# Patient Record
Sex: Female | Born: 1935 | Race: White | Hispanic: No | Smoking: Former smoker
Health system: Southern US, Community
[De-identification: ages and names within clinical notes are randomized; demographics above are authoritative.]

## PROBLEM LIST (undated history)

## (undated) DIAGNOSIS — K635 Polyp of colon: Secondary | ICD-10-CM

## (undated) DIAGNOSIS — R911 Solitary pulmonary nodule: Secondary | ICD-10-CM

## (undated) DIAGNOSIS — G629 Polyneuropathy, unspecified: Secondary | ICD-10-CM

## (undated) DIAGNOSIS — J300 Vasomotor rhinitis: Secondary | ICD-10-CM

## (undated) DIAGNOSIS — I700 Atherosclerosis of aorta: Secondary | ICD-10-CM

## (undated) DIAGNOSIS — R0600 Dyspnea, unspecified: Secondary | ICD-10-CM

## (undated) DIAGNOSIS — M0510 Rheumatoid lung disease with rheumatoid arthritis of unspecified site: Secondary | ICD-10-CM

## (undated) DIAGNOSIS — M419 Scoliosis, unspecified: Secondary | ICD-10-CM

## (undated) DIAGNOSIS — C801 Malignant (primary) neoplasm, unspecified: Secondary | ICD-10-CM

## (undated) DIAGNOSIS — K2270 Barrett's esophagus without dysplasia: Secondary | ICD-10-CM

## (undated) DIAGNOSIS — G43009 Migraine without aura, not intractable, without status migrainosus: Secondary | ICD-10-CM

## (undated) DIAGNOSIS — K529 Noninfective gastroenteritis and colitis, unspecified: Secondary | ICD-10-CM

## (undated) DIAGNOSIS — I2699 Other pulmonary embolism without acute cor pulmonale: Secondary | ICD-10-CM

## (undated) DIAGNOSIS — M8580 Other specified disorders of bone density and structure, unspecified site: Secondary | ICD-10-CM

## (undated) DIAGNOSIS — M1990 Unspecified osteoarthritis, unspecified site: Secondary | ICD-10-CM

## (undated) DIAGNOSIS — N183 Chronic kidney disease, stage 3 unspecified: Secondary | ICD-10-CM

## (undated) DIAGNOSIS — I272 Pulmonary hypertension, unspecified: Secondary | ICD-10-CM

## (undated) DIAGNOSIS — D649 Anemia, unspecified: Secondary | ICD-10-CM

## (undated) DIAGNOSIS — I050 Rheumatic mitral stenosis: Secondary | ICD-10-CM

## (undated) DIAGNOSIS — E785 Hyperlipidemia, unspecified: Secondary | ICD-10-CM

## (undated) DIAGNOSIS — I739 Peripheral vascular disease, unspecified: Secondary | ICD-10-CM

## (undated) DIAGNOSIS — I10 Essential (primary) hypertension: Secondary | ICD-10-CM

## (undated) DIAGNOSIS — I2510 Atherosclerotic heart disease of native coronary artery without angina pectoris: Secondary | ICD-10-CM

## (undated) DIAGNOSIS — E119 Type 2 diabetes mellitus without complications: Secondary | ICD-10-CM

## (undated) DIAGNOSIS — K5792 Diverticulitis of intestine, part unspecified, without perforation or abscess without bleeding: Secondary | ICD-10-CM

## (undated) DIAGNOSIS — L661 Lichen planopilaris, unspecified: Secondary | ICD-10-CM

## (undated) DIAGNOSIS — C4492 Squamous cell carcinoma of skin, unspecified: Secondary | ICD-10-CM

## (undated) DIAGNOSIS — J432 Centrilobular emphysema: Secondary | ICD-10-CM

## (undated) DIAGNOSIS — I517 Cardiomegaly: Secondary | ICD-10-CM

## (undated) DIAGNOSIS — I779 Disorder of arteries and arterioles, unspecified: Secondary | ICD-10-CM

## (undated) DIAGNOSIS — K219 Gastro-esophageal reflux disease without esophagitis: Secondary | ICD-10-CM

## (undated) DIAGNOSIS — M51369 Other intervertebral disc degeneration, lumbar region without mention of lumbar back pain or lower extremity pain: Secondary | ICD-10-CM

## (undated) DIAGNOSIS — K8020 Calculus of gallbladder without cholecystitis without obstruction: Secondary | ICD-10-CM

## (undated) HISTORY — PX: BREAST BIOPSY: SHX20

## (undated) HISTORY — PX: TONSILLECTOMY: SUR1361

## (undated) HISTORY — PX: APPENDECTOMY: SHX54

---

## 2006-02-20 ENCOUNTER — Ambulatory Visit: Payer: Self-pay | Admitting: Internal Medicine

## 2006-05-08 ENCOUNTER — Ambulatory Visit: Payer: Self-pay | Admitting: Ophthalmology

## 2006-05-14 ENCOUNTER — Ambulatory Visit: Payer: Self-pay | Admitting: Ophthalmology

## 2007-02-24 ENCOUNTER — Ambulatory Visit: Payer: Self-pay | Admitting: Internal Medicine

## 2007-02-26 ENCOUNTER — Ambulatory Visit: Payer: Self-pay | Admitting: Internal Medicine

## 2008-03-17 ENCOUNTER — Ambulatory Visit: Payer: Self-pay | Admitting: Internal Medicine

## 2008-07-18 ENCOUNTER — Ambulatory Visit: Payer: Self-pay | Admitting: Internal Medicine

## 2009-04-03 ENCOUNTER — Ambulatory Visit: Payer: Self-pay | Admitting: Internal Medicine

## 2010-04-05 ENCOUNTER — Ambulatory Visit: Payer: Self-pay | Admitting: Internal Medicine

## 2010-04-16 ENCOUNTER — Ambulatory Visit: Payer: Self-pay | Admitting: Internal Medicine

## 2010-12-20 ENCOUNTER — Other Ambulatory Visit: Payer: Self-pay | Admitting: Internal Medicine

## 2011-04-23 ENCOUNTER — Ambulatory Visit: Payer: Self-pay | Admitting: Podiatry

## 2011-04-23 ENCOUNTER — Ambulatory Visit: Payer: Self-pay | Admitting: Internal Medicine

## 2012-04-30 ENCOUNTER — Ambulatory Visit: Payer: Self-pay | Admitting: Internal Medicine

## 2013-04-07 ENCOUNTER — Ambulatory Visit: Payer: Self-pay | Admitting: Gastroenterology

## 2013-05-12 ENCOUNTER — Ambulatory Visit: Payer: Self-pay | Admitting: Gastroenterology

## 2013-05-14 ENCOUNTER — Ambulatory Visit: Payer: Self-pay | Admitting: Internal Medicine

## 2013-08-30 DIAGNOSIS — E559 Vitamin D deficiency, unspecified: Secondary | ICD-10-CM | POA: Insufficient documentation

## 2013-08-30 DIAGNOSIS — K2100 Gastro-esophageal reflux disease with esophagitis, without bleeding: Secondary | ICD-10-CM | POA: Insufficient documentation

## 2013-08-30 DIAGNOSIS — J300 Vasomotor rhinitis: Secondary | ICD-10-CM | POA: Insufficient documentation

## 2013-08-30 DIAGNOSIS — E1169 Type 2 diabetes mellitus with other specified complication: Secondary | ICD-10-CM | POA: Insufficient documentation

## 2013-08-30 DIAGNOSIS — M8580 Other specified disorders of bone density and structure, unspecified site: Secondary | ICD-10-CM | POA: Insufficient documentation

## 2013-09-23 DIAGNOSIS — Z79899 Other long term (current) drug therapy: Secondary | ICD-10-CM | POA: Insufficient documentation

## 2013-09-23 DIAGNOSIS — K2270 Barrett's esophagus without dysplasia: Secondary | ICD-10-CM | POA: Insufficient documentation

## 2013-09-23 DIAGNOSIS — I6529 Occlusion and stenosis of unspecified carotid artery: Secondary | ICD-10-CM | POA: Insufficient documentation

## 2013-09-23 DIAGNOSIS — N183 Chronic kidney disease, stage 3 unspecified: Secondary | ICD-10-CM | POA: Insufficient documentation

## 2013-09-28 ENCOUNTER — Ambulatory Visit: Payer: Self-pay | Admitting: Gastroenterology

## 2013-09-28 ENCOUNTER — Ambulatory Visit: Payer: Self-pay | Admitting: Family

## 2014-05-18 ENCOUNTER — Ambulatory Visit: Payer: Self-pay | Admitting: Internal Medicine

## 2015-02-08 ENCOUNTER — Ambulatory Visit: Payer: Medicare PPO | Admitting: Occupational Therapy

## 2015-02-10 ENCOUNTER — Ambulatory Visit: Payer: Medicare PPO | Admitting: Occupational Therapy

## 2015-02-13 ENCOUNTER — Ambulatory Visit: Payer: Medicare PPO | Attending: Rheumatology | Admitting: Occupational Therapy

## 2015-02-13 ENCOUNTER — Ambulatory Visit: Payer: Medicare PPO | Admitting: Occupational Therapy

## 2015-02-13 DIAGNOSIS — M79642 Pain in left hand: Secondary | ICD-10-CM | POA: Diagnosis not present

## 2015-02-13 DIAGNOSIS — M79641 Pain in right hand: Secondary | ICD-10-CM

## 2015-02-13 NOTE — Therapy (Signed)
[facility] [address], [place], [address] Phone: [phone]   Fax:  [phone]  Occupational Therapy Treatment  Patient Details  Name: Katie Cunningham MRN: [id number] Date of Birth: 01/02/1932 Referring Provider: Jefm Bryant  Encounter Date: 02/13/2015      OT End of Session - 02/13/15 1714    Visit Number 1   Number of Visits 1   Date for OT Re-Evaluation 02/13/15   OT Start Time 1140   OT Stop Time 1221   OT Time Calculation (min) 41 min   Activity Tolerance Patient tolerated treatment well   Behavior During Therapy [facility] for tasks assessed/performed      No past medical history on file.  No past surgical history on file.  There were no vitals filed for this visit.  Visit Diagnosis:  Pain of left hand  Pain of right hand      Subjective Assessment - 02/13/15 1706    Subjective  My R hand thumb and sometimes the index finger locks , especially when I blowdry my hair   Patient Stated Goals Don't want my hands to get worse   Currently in Pain? Yes   Pain Score 3    Pain Location Finger (Comment which one)   Pain Orientation Right   Aggravating Factors  When gripping object tight             [facility] OT Assessment - 02/13/15 0001    Assessment   Diagnosis Bilateral hand pain    Referring Provider [facility]   Onset Date 01/30/15   Assessment Pt present with referrall for hand therapy - pt denies pain except when R thumb locks when blow drying    Balance Screen   Has the patient fallen in the past 6 months No   Has the patient had a decrease in activity level because of a fear of falling?  No   Is the patient reluctant to leave their home because of a fear of falling?  No   Home  Environment   Lives With Alone   Prior Function   Level of Independence Independent   Leisure R hand dominant, do own housework, bowl once week , read, do puzzles and play on computer garmes   Strength   Right Hand Grip (lbs) 39   Right Hand Lateral Pinch 14 lbs   Right Hand 3 Point Pinch 15 lbs   Left Hand Grip (lbs) 25   Left Hand Lateral Pinch 13 lbs   Left Hand 3 Point Pinch 15 lbs   Right Hand AROM   R Thumb Radial ABduction/ADduction 0-55 35   R Thumb Palmar ABduction/ADduction 0-45 50   R Thumb Opposition to Index --  WNL   Left Hand AROM   L Thumb Radial ADduction/ABduction 0-55 33   L Thumb Palmar ADduction/ABduction 0-45 40   L Thumb Opposition to Index --  WNL                          OT Education - 02/13/15 1713    Education provided Yes   Education Details HEP and joint protection   Person(s) Educated Patient   Methods Explanation;Demonstration;Tactile cues;Verbal cues;Handout   Comprehension Verbal cues required;Returned demonstration;Verbalized understanding             OT Long Term Goals - 02/13/15 1721    OT LONG TERM GOAL #1   Title Pt able to  demo HEP independent - and verbalize 2 AE to ease tasks at home   Time 1   Period Days   Status Achieved               Plan - 23-Feb-2015 1714    Clinical Impression Statement Pt present with referral for pain - only pain is when R thumb locks mostly when blow drying hair - pt has normal AROM in bilateral hands except PA and RA - provided HEP . Also reviewed with pt modifications and jointprotection  to maintain ROM and decrease triggering of thumb - hand out provided. Pt has more than normal grip and prehension strenght for her age - except L grip decrease but pt do not complain    OT Frequency One time visit   Consulted and Agree with Plan of Care Patient          G-Codes - 2015/02/23 1722    Functional Assessment Tool Used ROM , grip , clnicial judgement - pain scale   Functional Limitation Self care   Self Care Current Status [id number]) At least 1 percent but less than 20 percent impaired, limited or restricted   Self Care Goal Status [id number]) At least 1 percent but less than 20  percent impaired, limited or restricted      Problem List There are no active problems to display for this patient.   Rosalyn Gess  OTR/L,CLT  February 23, 2015, 5:28 PM  [facility] [address], [place], [address] Phone: [phone]   Fax:  [phone]  Name: Katie Cunningham MRN: [id number] Date of Birth: 12-06-1931

## 2015-02-13 NOTE — Patient Instructions (Signed)
HEP for ROM provided - after moist heat in am and pm  Tendon glides Opposition Thumb PA and RA  Bilateral hands  Ed and hand out provided for joint protection and AE

## 2015-04-03 ENCOUNTER — Other Ambulatory Visit: Payer: Self-pay | Admitting: Internal Medicine

## 2015-04-03 DIAGNOSIS — Z1231 Encounter for screening mammogram for malignant neoplasm of breast: Secondary | ICD-10-CM

## 2015-05-24 ENCOUNTER — Ambulatory Visit
Admission: RE | Admit: 2015-05-24 | Discharge: 2015-05-24 | Disposition: A | Discharge status: Home / Self Care | Payer: Medicare PPO | Source: Ambulatory Visit | Attending: Internal Medicine | Admitting: Internal Medicine

## 2015-05-24 DIAGNOSIS — Z1231 Encounter for screening mammogram for malignant neoplasm of breast: Secondary | ICD-10-CM | POA: Diagnosis not present

## 2015-05-24 HISTORY — DX: Malignant (primary) neoplasm, unspecified: C80.1

## 2015-08-01 ENCOUNTER — Emergency Department: Payer: Medicare PPO

## 2015-08-01 ENCOUNTER — Encounter: Payer: Self-pay | Admitting: *Deleted

## 2015-08-01 ENCOUNTER — Emergency Department
Admission: EM | Admit: 2015-08-01 | Discharge: 2015-08-01 | Disposition: A | Discharge status: Home / Self Care | Payer: Medicare PPO | Attending: Emergency Medicine | Admitting: Emergency Medicine

## 2015-08-01 DIAGNOSIS — R262 Difficulty in walking, not elsewhere classified: Secondary | ICD-10-CM | POA: Diagnosis not present

## 2015-08-01 DIAGNOSIS — M79605 Pain in left leg: Secondary | ICD-10-CM | POA: Diagnosis present

## 2015-08-01 DIAGNOSIS — R52 Pain, unspecified: Secondary | ICD-10-CM

## 2015-08-01 DIAGNOSIS — M79662 Pain in left lower leg: Secondary | ICD-10-CM | POA: Diagnosis not present

## 2015-08-01 DIAGNOSIS — Z85828 Personal history of other malignant neoplasm of skin: Secondary | ICD-10-CM | POA: Diagnosis not present

## 2015-08-01 DIAGNOSIS — R2689 Other abnormalities of gait and mobility: Secondary | ICD-10-CM

## 2015-08-01 LAB — BASIC METABOLIC PANEL
Anion gap: 8 (ref 5–15)
BUN: 25 mg/dL — AB (ref 6–20)
CO2: 26 mmol/L (ref 22–32)
CREATININE: 1.09 mg/dL — AB (ref 0.44–1.00)
Calcium: 9.8 mg/dL (ref 8.9–10.3)
Chloride: 104 mmol/L (ref 101–111)
GFR calc Af Amer: 54 mL/min — ABNORMAL LOW (ref >60)
GFR, EST NON AFRICAN AMERICAN: 46 mL/min — AB (ref >60)
Glucose, Bld: 159 mg/dL — ABNORMAL HIGH (ref 65–99)
Potassium: 4.3 mmol/L (ref 3.5–5.1)
SODIUM: 138 mmol/L (ref 135–145)

## 2015-08-01 MED ORDER — OXYCODONE HCL 5 MG PO TABS: 5.0000 mg | ORAL_TABLET | Freq: Three times a day (TID) | ORAL | Status: AC | PRN

## 2015-08-01 NOTE — Discharge Instructions (Signed)
You may ice your calf for 10 minutes every 2 hours if this improves her pain. You may continue to take Tylenol, and take oxycodone for severe pain that is not improved with just Tylenol. Do not drive within 8 hours of taking oxycodone.  Return to the emergency department if you develop severe pain, chest pain or shortness of breath, fever, or any other symptoms concerning to you.

## 2015-08-01 NOTE — ED Notes (Signed)
Pt complains of left lower leg pain, pt denies chest pain and any other symptoms

## 2015-08-01 NOTE — ED Notes (Signed)
Pt reports that she awakened this morning with her left leg feeling "different." Pt went bowling but was unable to finish the game due to the pain in her leg. Pt reports that she has had muscle pain before, but not like this. Pt alert & oriented with NAD Noted.

## 2015-08-01 NOTE — ED Provider Notes (Signed)
[facility] Emergency Department Provider Note  ____________________________________________  Time seen: Approximately 4:26 PM  I have reviewed the triage vital signs and the nursing notes.   HISTORY  Chief Complaint Leg Pain    HPI Katie Cunningham is a 82 y.o. female with a history of breast CA presenting with left calf pain. The patient reports that she woke up this morning, and has had a "sore" sensation in the left calf. She tried Tylenol, which took the edge off but did not resolve her pain. She had to stop her bowling game today because of pain and was walking with a limp. She denies any trauma, chest pain or shortness of breath, personal history of blood clots although she has a sister with a blood clot history, fever,or new exercise regimen.   Past Medical History  Diagnosis Date  . Cancer ([place])     skin ca    There are no active problems to display for this patient.   Past Surgical History  Procedure Laterality Date  . Breast biopsy Left     neg    Current Outpatient Rx  Name  Route  Sig  Dispense  Refill  . oxyCODONE (ROXICODONE) 5 MG immediate release tablet   Oral   Take 1 tablet (5 mg total) by mouth every 8 (eight) hours as needed for severe pain.   6 tablet   0     Allergies Review of patient's allergies indicates no known allergies.  Family History  Problem Relation Age of Onset  . Breast cancer Sister 65    Social History Social History  Substance Use Topics  . Smoking status: None  . Smokeless tobacco: None  . Alcohol Use: None    Review of Systems Constitutional: No fever/chills.No lightheadedness or syncope. Eyes: No visual changes. ENT: No sore throat. No congestion or rhinorrhea. Cardiovascular: Denies chest pain. Denies palpitations. Respiratory: Denies shortness of breath.  No cough. Gastrointestinal: No abdominal pain.  No nausea, no vomiting.  No diarrhea.  No constipation. Musculoskeletal:  Negative for back pain. Positive left calf pain. Skin: Negative for rash. Neurological: Negative for headaches. No focal numbness, tingling or weakness.   10-point ROS otherwise negative.  ____________________________________________   PHYSICAL EXAM:  VITAL SIGNS: ED Triage Vitals  Enc Vitals Group     BP 08/01/15 1514 150/77 mmHg     Pulse Rate 08/01/15 1514 93     Resp 08/01/15 1514 20     Temp 08/01/15 1514 98 F (36.7 C)     Temp Source 08/01/15 1514 Oral     SpO2 08/01/15 1514 98 %     Weight 08/01/15 1514 140 lb (63.504 kg)     Height 08/01/15 1514 5\' 4"  (1.626 m)     Head Cir --      Peak Flow --      Pain Score 08/01/15 1514 5     Pain Loc --      Pain Edu? --      Excl. in [place]? --     Constitutional: Alert and oriented. Well appearing and in no acute distress. Answers questions appropriately. Eyes: Conjunctivae are normal.  EOMI. No scleral icterus. Head: Atraumatic. Nose: No congestion/rhinnorhea. Mouth/Throat: Mucous membranes are moist.  Neck: No stridor.  Supple.  No JVD. Cardiovascular: Normal rate, regular rhythm. No murmurs, rubs or gallops.  Respiratory: Normal respiratory effort.  No accessory muscle use or retractions. Lungs CTAB.  No wheezes, rales or ronchi. Gastrointestinal: Soft, nontender  and nondistended.  No guarding or rebound.  No peritoneal signs. Musculoskeletal: Left lower extremity is slightly larger than the right lower extremity, although the patient states that this is chronic and unchanged today. Tenderness to palpation diffusely throughout the left calf without any palpable cords. Negative Homans sign. No evidence of overlying erythema, or skin break. No knee effusion. Full range of motion of the left ankle, knee and hip without pain. Normal DP and PT pulses bilaterally. Sensation to light touch throughout the bilateral lower extremities. Neurologic:  A&Ox3.  Speech is clear.  Face and smile are symmetric.  EOMI.  Moves all extremities  well. Skin:  Skin is warm, dry and intact. No rash noted. Psychiatric: Mood and affect are normal. Speech and behavior are normal.  Normal judgement.  ____________________________________________   LABS (all labs ordered are listed, but only abnormal results are displayed)  Labs Reviewed  BASIC METABOLIC PANEL - Abnormal; Notable for the following:    Glucose, Bld 159 (*)    BUN 25 (*)    Creatinine, Ser 1.09 (*)    GFR calc non Af Amer 46 (*)    GFR calc Af Amer 54 (*)    All other components within normal limits   ____________________________________________  EKG  Not indicated ____________________________________________  RADIOLOGY  US Venous Img Lower Unilateral Left  08/01/2015  CLINICAL DATA:  Left lower extremity pain for 1 day. 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 EXAM: LEFT LOWER EXTREMITY VENOUS DOPPLER ULTRASOUND TECHNIQUE: Gray-scale sonography with graded compression, as well as color Doppler and duplex ultrasound were performed to evaluate the lower extremity deep venous systems from the level of the common femoral vein and including the common femoral, femoral, profunda femoral, popliteal and calf veins including the posterior tibial, peroneal and gastrocnemius veins when visible. The superficial great saphenous vein was also interrogated. Spectral Doppler was utilized to evaluate flow at rest and with distal augmentation maneuvers in the common femoral, femoral and popliteal veins. COMPARISON:  04/23/2011 FINDINGS: Contralateral Common Femoral Vein: Respiratory phasicity is normal and symmetric with the symptomatic side. No evidence of thrombus. Normal compressibility. Common Femoral Vein: No evidence of thrombus. Normal compressibility, respiratory phasicity and response to augmentation. Saphenofemoral Junction: No evidence of thrombus. Normal compressibility and flow on color Doppler imaging. Profunda Femoral Vein: No evidence of thrombus.  Normal compressibility and flow on color Doppler imaging. Femoral Vein: No evidence of thrombus. Normal compressibility, respiratory phasicity and response to augmentation. Popliteal Vein: No evidence of thrombus. Normal compressibility, respiratory phasicity and response to augmentation. Calf Veins: No evidence of thrombus. Normal compressibility and flow on color Doppler imaging. Superficial Great Saphenous Vein: No evidence of thrombus. Normal compressibility and flow on color Doppler imaging. Venous Reflux:  None. Other Findings:  None. IMPRESSION: No evidence of deep venous thrombosis. Electronically Signed   By: Logan Bores M.D.   On: 08/01/2015 16:11    ____________________________________________   PROCEDURES  Procedure(s) performed: None  Critical Care performed: No ____________________________________________   INITIAL IMPRESSION / ASSESSMENT AND PLAN / ED COURSE  Pertinent labs & imaging results that were available during my care of the patient were reviewed by me and considered in my medical decision making (see chart for details).  82 y.o. female with a history of breast CA presenting with left calf pain. I will evaluate the patient for DVT, and consider muscle spasms related to electrolyte abnormalities. The patient may also have musculoskeletal strain.  ----------------------------------------- 4:40 PM on 08/01/2015 -----------------------------------------  The patient's DVT study is negative. I will get her electrolytes, and reevaluate her. She is 32 had Tylenol today and does not wish to have any further pain medication at this time.  ____________________________________________  FINAL CLINICAL IMPRESSION(S) / ED DIAGNOSES  Final diagnoses:  Calf pain, left  Limp      NEW MEDICATIONS STARTED DURING THIS VISIT:  Discharge Medication List as of 08/01/2015  4:42 PM    START taking these medications   Details  oxyCODONE (ROXICODONE) 5 MG immediate release  tablet Take 1 tablet (5 mg total) by mouth every 8 (eight) hours as needed for severe pain., Starting 08/01/2015, Until Wed 07/31/16, Print         Eula Listen, MD 08/02/15 0010

## 2015-10-26 ENCOUNTER — Other Ambulatory Visit: Payer: Self-pay | Admitting: Vascular Surgery

## 2015-11-06 ENCOUNTER — Other Ambulatory Visit
Admission: RE | Admit: 2015-11-06 | Discharge: 2015-11-06 | Disposition: A | Discharge status: Home / Self Care | Payer: Medicare PPO | Source: Ambulatory Visit | Attending: Vascular Surgery | Admitting: Vascular Surgery

## 2015-11-06 DIAGNOSIS — Z01818 Encounter for other preprocedural examination: Secondary | ICD-10-CM | POA: Diagnosis present

## 2015-11-06 LAB — CREATININE, SERUM
CREATININE: 0.97 mg/dL (ref 0.44–1.00)
GFR, EST NON AFRICAN AMERICAN: 53 mL/min — AB (ref >60)

## 2015-11-06 LAB — BUN: BUN: 20 mg/dL (ref 6–20)

## 2015-11-07 ENCOUNTER — Ambulatory Visit
Admission: RE | Admit: 2015-11-07 | Discharge: 2015-11-07 | Disposition: A | Discharge status: Home / Self Care | Payer: Medicare PPO | Source: Ambulatory Visit | Attending: Vascular Surgery | Admitting: Vascular Surgery

## 2015-11-07 ENCOUNTER — Encounter: Payer: Self-pay | Admitting: *Deleted

## 2015-11-07 ENCOUNTER — Encounter
Admission: RE | Disposition: A | Discharge status: Home / Self Care | Payer: Self-pay | Source: Ambulatory Visit | Attending: Vascular Surgery

## 2015-11-07 DIAGNOSIS — M7989 Other specified soft tissue disorders: Secondary | ICD-10-CM | POA: Diagnosis not present

## 2015-11-07 DIAGNOSIS — Z823 Family history of stroke: Secondary | ICD-10-CM | POA: Diagnosis not present

## 2015-11-07 DIAGNOSIS — E1122 Type 2 diabetes mellitus with diabetic chronic kidney disease: Secondary | ICD-10-CM | POA: Diagnosis not present

## 2015-11-07 DIAGNOSIS — Z833 Family history of diabetes mellitus: Secondary | ICD-10-CM | POA: Diagnosis not present

## 2015-11-07 DIAGNOSIS — E785 Hyperlipidemia, unspecified: Secondary | ICD-10-CM | POA: Diagnosis not present

## 2015-11-07 DIAGNOSIS — I6523 Occlusion and stenosis of bilateral carotid arteries: Secondary | ICD-10-CM | POA: Diagnosis not present

## 2015-11-07 DIAGNOSIS — I70213 Atherosclerosis of native arteries of extremities with intermittent claudication, bilateral legs: Secondary | ICD-10-CM | POA: Insufficient documentation

## 2015-11-07 DIAGNOSIS — I129 Hypertensive chronic kidney disease with stage 1 through stage 4 chronic kidney disease, or unspecified chronic kidney disease: Secondary | ICD-10-CM | POA: Insufficient documentation

## 2015-11-07 DIAGNOSIS — Z794 Long term (current) use of insulin: Secondary | ICD-10-CM | POA: Diagnosis not present

## 2015-11-07 DIAGNOSIS — Z87891 Personal history of nicotine dependence: Secondary | ICD-10-CM | POA: Insufficient documentation

## 2015-11-07 DIAGNOSIS — I890 Lymphedema, not elsewhere classified: Secondary | ICD-10-CM | POA: Diagnosis not present

## 2015-11-07 DIAGNOSIS — N189 Chronic kidney disease, unspecified: Secondary | ICD-10-CM | POA: Diagnosis not present

## 2015-11-07 DIAGNOSIS — Z7982 Long term (current) use of aspirin: Secondary | ICD-10-CM | POA: Insufficient documentation

## 2015-11-07 DIAGNOSIS — M1990 Unspecified osteoarthritis, unspecified site: Secondary | ICD-10-CM | POA: Diagnosis not present

## 2015-11-07 HISTORY — DX: Gastro-esophageal reflux disease without esophagitis: K21.9

## 2015-11-07 HISTORY — PX: PERIPHERAL VASCULAR CATHETERIZATION: SHX172C

## 2015-11-07 HISTORY — DX: Polyneuropathy, unspecified: G62.9

## 2015-11-07 HISTORY — DX: Essential (primary) hypertension: I10

## 2015-11-07 HISTORY — DX: Unspecified osteoarthritis, unspecified site: M19.90

## 2015-11-07 HISTORY — DX: Hyperlipidemia, unspecified: E78.5

## 2015-11-07 HISTORY — DX: Type 2 diabetes mellitus without complications: E11.9

## 2015-11-07 HISTORY — DX: Peripheral vascular disease, unspecified: I73.9

## 2015-11-07 SURGERY — LOWER EXTREMITY ANGIOGRAPHY
Anesthesia: Moderate Sedation | Laterality: Right

## 2015-11-07 MED ORDER — ACETAMINOPHEN 325 MG RE SUPP: 325.0000 mg | RECTAL | Status: DC | PRN

## 2015-11-07 MED ORDER — METHYLPREDNISOLONE SODIUM SUCC 125 MG IJ SOLR: 125.0000 mg | INTRAMUSCULAR | Status: DC | PRN

## 2015-11-07 MED ORDER — LIDOCAINE HCL (PF) 1 % IJ SOLN
INTRAMUSCULAR | Status: AC
  Filled 2015-11-07: qty 30

## 2015-11-07 MED ORDER — DEXTROSE 5 % IV SOLN
1.5000 g | INTRAVENOUS | Status: AC
  Administered 2015-11-07: 1.5 g via INTRAVENOUS

## 2015-11-07 MED ORDER — MORPHINE SULFATE (PF) 4 MG/ML IV SOLN: 2.0000 mg | INTRAVENOUS | Status: DC | PRN

## 2015-11-07 MED ORDER — ALUM & MAG HYDROXIDE-SIMETH 200-200-20 MG/5ML PO SUSP: 15.0000 mL | ORAL | Status: DC | PRN

## 2015-11-07 MED ORDER — ONDANSETRON HCL 4 MG/2ML IJ SOLN: 4.0000 mg | Freq: Four times a day (QID) | INTRAMUSCULAR | Status: DC | PRN

## 2015-11-07 MED ORDER — HEPARIN (PORCINE) IN NACL 2-0.9 UNIT/ML-% IJ SOLN
INTRAMUSCULAR | Status: AC
  Filled 2015-11-07: qty 1000

## 2015-11-07 MED ORDER — LABETALOL HCL 5 MG/ML IV SOLN: 10.0000 mg | INTRAVENOUS | Status: DC | PRN

## 2015-11-07 MED ORDER — CLOPIDOGREL BISULFATE 75 MG PO TABS
300.0000 mg | ORAL_TABLET | Freq: Once | ORAL | Status: AC
  Administered 2015-11-07: 300 mg via ORAL
  Filled 2015-11-07: qty 4

## 2015-11-07 MED ORDER — PANTOPRAZOLE SODIUM 40 MG PO TBEC: 40.0000 mg | DELAYED_RELEASE_TABLET | Freq: Every day | ORAL | Status: DC

## 2015-11-07 MED ORDER — IOPAMIDOL (ISOVUE-300) INJECTION 61%
INTRAVENOUS | Status: DC | PRN
  Administered 2015-11-07: 125 mL via INTRA_ARTERIAL

## 2015-11-07 MED ORDER — DOCUSATE SODIUM 100 MG PO CAPS: 100.0000 mg | ORAL_CAPSULE | Freq: Every day | ORAL | Status: DC

## 2015-11-07 MED ORDER — MIDAZOLAM HCL 2 MG/2ML IJ SOLN
INTRAMUSCULAR | Status: DC | PRN
  Administered 2015-11-07 (×2): 1 mg via INTRAVENOUS
  Administered 2015-11-07: 2 mg via INTRAVENOUS
  Administered 2015-11-07: 1 mg via INTRAVENOUS

## 2015-11-07 MED ORDER — CLOPIDOGREL BISULFATE 75 MG PO TABS: 75.0000 mg | ORAL_TABLET | Freq: Every day | ORAL | 5 refills | Status: AC

## 2015-11-07 MED ORDER — HEPARIN SODIUM (PORCINE) 1000 UNIT/ML IJ SOLN
INTRAMUSCULAR | Status: AC
  Filled 2015-11-07: qty 1

## 2015-11-07 MED ORDER — FAMOTIDINE 20 MG PO TABS: 40.0000 mg | ORAL_TABLET | ORAL | Status: DC | PRN

## 2015-11-07 MED ORDER — ACETAMINOPHEN 325 MG PO TABS: 325.0000 mg | ORAL_TABLET | ORAL | Status: DC | PRN

## 2015-11-07 MED ORDER — SODIUM CHLORIDE 0.9 % IV SOLN
INTRAVENOUS | Status: DC
  Administered 2015-11-07: 09:00:00 via INTRAVENOUS

## 2015-11-07 MED ORDER — OXYCODONE HCL 5 MG PO TABS: 5.0000 mg | ORAL_TABLET | ORAL | Status: DC | PRN

## 2015-11-07 MED ORDER — FENTANYL CITRATE (PF) 100 MCG/2ML IJ SOLN
INTRAMUSCULAR | Status: DC | PRN
  Administered 2015-11-07 (×4): 50 ug via INTRAVENOUS

## 2015-11-07 MED ORDER — HYDROMORPHONE HCL 1 MG/ML IJ SOLN: 1.0000 mg | Freq: Once | INTRAMUSCULAR | Status: DC

## 2015-11-07 MED ORDER — METOPROLOL TARTRATE 5 MG/5ML IV SOLN: 5.0000 mg | Freq: Four times a day (QID) | INTRAVENOUS | Status: DC

## 2015-11-07 MED ORDER — HEPARIN SODIUM (PORCINE) 1000 UNIT/ML IJ SOLN
INTRAMUSCULAR | Status: DC | PRN
  Administered 2015-11-07: 5000 [IU] via INTRAVENOUS

## 2015-11-07 MED ORDER — FENTANYL CITRATE (PF) 100 MCG/2ML IJ SOLN
INTRAMUSCULAR | Status: AC
  Filled 2015-11-07: qty 2

## 2015-11-07 MED ORDER — MIDAZOLAM HCL 5 MG/5ML IJ SOLN
INTRAMUSCULAR | Status: AC
  Filled 2015-11-07: qty 5

## 2015-11-07 MED ORDER — HYDRALAZINE HCL 20 MG/ML IJ SOLN: 5.0000 mg | INTRAMUSCULAR | Status: DC | PRN

## 2015-11-07 SURGICAL SUPPLY — 15 items
BALLN DORADO 9X40X80 (BALLOONS) ×6
BALLOON DORADO 9X40X80 (BALLOONS) ×2 IMPLANT
CATH PIG 70CM (CATHETERS) ×3 IMPLANT
DEVICE PRESTO INFLATION (MISCELLANEOUS) ×6 IMPLANT
DEVICE STARCLOSE SE CLOSURE (Vascular Products) ×6 IMPLANT
PACK ANGIOGRAPHY (CUSTOM PROCEDURE TRAY) ×3 IMPLANT
SET INTRO CAPELLA COAXIAL (SET/KITS/TRAYS/PACK) ×3 IMPLANT
SHEATH BRITE TIP 5FRX11 (SHEATH) ×3 IMPLANT
SHEATH BRITE TIP 7FRX11 (SHEATH) ×6 IMPLANT
STENT LIFESTREAM 8X37X80 (Permanent Stent) ×3 IMPLANT
STENT VIABAHN 8X39X80 VBX (Permanent Stent) ×3 IMPLANT
SYR MEDRAD MARK V 150ML (SYRINGE) ×6 IMPLANT
TUBING CONTRAST HIGH PRESS 72 (TUBING) ×6 IMPLANT
WIRE J 3MM .035X145CM (WIRE) ×3 IMPLANT
WIRE MAGIC TORQUE 260C (WIRE) ×6 IMPLANT

## 2015-11-07 NOTE — Progress Notes (Signed)
Patient sitting up and eating lunch. Tolerating PO well. Has been given plavix. Will continue to monitor.

## 2015-11-07 NOTE — Op Note (Signed)
[facility]  Percutaneous Study/Intervention Procedural Note   Date of Surgery: 11/07/2015  Surgeon:Schnier, Dolores Lory   Pre-operative Diagnosis: Atherosclerotic occlusive disease bilateral lower extremities with lifestyle limiting claudication  Post-operative diagnosis:  Same  Procedure(s) Performed:  1.  Abdominal aortogram  2.  Bilateral distal runoff  3.  Percutaneous transluminal angioplasty and stent placement right common iliac artery; "kissing balloon" technique  4.  Percutaneous transluminal and plasty and stent placement left common iliac artery; "kissing balloon" technique  5.  Ultrasound guided access bilateral common femoral arteries  6.  StarClose closure device bilateral common femoral arteries  Anesthesia: Conscious sedation was administered under my direct supervision. IV Versed plus fentanyl were utilized. Continuous ECG, pulse oximetry and blood pressure was monitored throughout the entire procedure. Conscious sedation was for a total of 1 hour 15 minutes.  Sheath: 7 French sheath retrograde right common femoral artery; 7 French sheath retrograde left common femoral artery  Contrast: 125 cc  Fluoroscopy Time: 5.4 minutes  Indications:  A she presented to the office with increasing leg pain and complaints of the inability to walk to the point that she was unable to carry on her daily activities. Noninvasive studies as well as physical examinations demonstrated severe atherosclerotic occlusive disease particularly at the aortic bifurcation she is therefore undergoing angiography with the hope for intervention. The risks and benefits of been reviewed all questions been answered patient agrees to proceed.  Procedure:  Katie Cunningham a 82 y.o. female who was identified and appropriate procedural time out was performed.  The patient was then placed supine on the table and prepped and draped in the usual sterile fashion.  Ultrasound was used to  evaluate the left common femoral artery.  It was patent .  A digital ultrasound image was acquired.  A micropuncture needle was used to access the left common femoral artery under direct ultrasound guidance and a permanent image was performed.  Microwire followed by micro-sheath was then placed. A 0.035 J wire was advanced without resistance and a 5Fr sheath was placed.    The pigtail catheter was then positioned at the level of T12 and an AP image of the aorta was obtained. After review the images the pigtail catheter was repositioned above the aortic bifurcation and bilateral oblique views of the pelvis were obtained. Subsequently the detector was returned to the AP position and bilateral lower extremity runoff was obtained.  After review the images the ultrasound was reprepped and delivered back onto the sterile field. The right common femoral was then imaged with the ultrasound it was noted to be echolucent and pulsatile indicating patency. Images recorded for the permanent record. Under real-time visualization a microneedle was inserted into the anterior wall the common femoral artery microwire was then advanced without difficulty under fluoroscopic guidance followed by placement of the micro-sheath.  A Magic torque wire was then negotiated under fluoroscopic guidance into the aorta. 7 French sheath was then placed.  5000 Units of heparin was given and allowed to circulate for proximally 4 minutes.  The 5 French sheath was then upsized to a 7 [place] sheath as well after a Magic torque wire was advanced through the pigtail catheter. Magnified images of the aortic bifurcation were then made using hand injection contrast from the femoral sheaths. After appropriate sizing a Gore VBX 8 x 38 stent was selected for the right and a Lifeseal 8 x 37 stent was selected for the left. There were then advanced and positioned  just above the aortic bifurcation. Insufflation for full expansion of the stents was  performed simultaneously. Follow-up imaging was then performed and the stents were noted to have significant residual stenosis at the aortic bifurcation as well as to be undersized within the aorta.  Therefore 2 9 x 4 [place] balloons were opened onto the field and advanced up the right and left sides and inflated simultaneously fully expanding both stents.  The pigtail catheter was then introduced up the right and bolus injection of contrast was used to perform final imaging of the distal aortic reconstruction.  Oblique views were then obtained of the groins in succession and Star close device is deployed without difficulty. There were no immediate complications   Findings:   Aortogram:  The abdominal aorta is opacified with a bolus injection contrast. Demonstrates diffuse disease but there are no hemodynamically significant lesions noted until the distal aortic bifurcation where bilateral greater than 70% ostial iliac on the left and a greater than 90% ostial iliac lesion on the right are identified. There is mild poststenotic dilatation noted of the common iliac arteries as well. The mid and distal portions of the common iliacs are widely patent bilaterally as are the external iliac arteries bilaterally.  Right Lower Extremity:  The right common femoral demonstrates bulky posterior calcified plaque. It is difficult to ascertain based on angiography the true degree of stenosis on the right however based on the ultrasound images it is certainly greater than 60%. The profunda and SFA are patent there is mild diffuse disease noted. Popliteal is patent. The trifurcation shows diffuse disease with the anterior tibial being patent and the dominant runoff to the foot peroneal is also noted. The posterior tibial appears to occlude near its origin remains occluded throughout its course.  Left Lower Extremity:  In similar fashion the left common femoral demonstrates bulky plaque again imaging with duplex ultrasound  suggests greater than 60% stenosis but by angiography in AP and oblique view it is hard to quantitate this. Profunda femoris and SFA are patent with diffuse disease. Trifurcation is again noted to have diffuse disease with occlusion of the posterior tibial several centimeters after it's origin. Anterior tibial is the dominant runoff to the foot and the peroneal does appear to be patent.  Following placement of the iliac stents there is now wide patency with less than 10% residual stenosis with rapid flow through the aortic bifurcation bilaterally. Distal runoff is preserved  Summary:  Successful reconstruction of the distal aorta and bilateral iliac arteries  Disposition: Patient was taken to the recovery room in stable condition having tolerated the procedure well.  Schnier, Dolores Lory 11/07/2015,11:48 AM

## 2015-11-07 NOTE — H&P (Signed)
[facility] History & Physical Update  The patient was interviewed and re-examined.  The patient's previous History and Physical has been reviewed and is unchanged.  There is no change in the plan of care. We plan to proceed with the scheduled procedure.  Schnier, Dolores Lory, MD  11/07/2015, 9:41 AM

## 2015-11-08 ENCOUNTER — Encounter: Payer: Self-pay | Admitting: Vascular Surgery

## 2015-11-20 DIAGNOSIS — I739 Peripheral vascular disease, unspecified: Secondary | ICD-10-CM | POA: Insufficient documentation

## 2015-12-27 ENCOUNTER — Other Ambulatory Visit (INDEPENDENT_AMBULATORY_CARE_PROVIDER_SITE_OTHER): Payer: Self-pay | Admitting: Vascular Surgery

## 2015-12-27 DIAGNOSIS — M7989 Other specified soft tissue disorders: Secondary | ICD-10-CM

## 2015-12-27 DIAGNOSIS — I70219 Atherosclerosis of native arteries of extremities with intermittent claudication, unspecified extremity: Secondary | ICD-10-CM

## 2015-12-27 DIAGNOSIS — M79606 Pain in leg, unspecified: Secondary | ICD-10-CM

## 2015-12-28 ENCOUNTER — Ambulatory Visit (INDEPENDENT_AMBULATORY_CARE_PROVIDER_SITE_OTHER): Payer: Medicare PPO

## 2015-12-28 ENCOUNTER — Ambulatory Visit (INDEPENDENT_AMBULATORY_CARE_PROVIDER_SITE_OTHER): Payer: Medicare PPO | Admitting: Vascular Surgery

## 2015-12-28 ENCOUNTER — Other Ambulatory Visit (INDEPENDENT_AMBULATORY_CARE_PROVIDER_SITE_OTHER): Payer: Self-pay | Admitting: Vascular Surgery

## 2015-12-28 ENCOUNTER — Encounter (INDEPENDENT_AMBULATORY_CARE_PROVIDER_SITE_OTHER): Payer: Self-pay | Admitting: Vascular Surgery

## 2015-12-28 DIAGNOSIS — E782 Mixed hyperlipidemia: Secondary | ICD-10-CM

## 2015-12-28 DIAGNOSIS — M7989 Other specified soft tissue disorders: Secondary | ICD-10-CM

## 2015-12-28 DIAGNOSIS — E785 Hyperlipidemia, unspecified: Secondary | ICD-10-CM | POA: Insufficient documentation

## 2015-12-28 DIAGNOSIS — M79604 Pain in right leg: Secondary | ICD-10-CM | POA: Diagnosis not present

## 2015-12-28 DIAGNOSIS — M79605 Pain in left leg: Secondary | ICD-10-CM | POA: Diagnosis not present

## 2015-12-28 DIAGNOSIS — I10 Essential (primary) hypertension: Secondary | ICD-10-CM | POA: Diagnosis not present

## 2015-12-28 DIAGNOSIS — I70213 Atherosclerosis of native arteries of extremities with intermittent claudication, bilateral legs: Secondary | ICD-10-CM | POA: Diagnosis not present

## 2015-12-28 DIAGNOSIS — G5791 Unspecified mononeuropathy of right lower limb: Secondary | ICD-10-CM | POA: Insufficient documentation

## 2015-12-28 DIAGNOSIS — I700 Atherosclerosis of aorta: Secondary | ICD-10-CM

## 2015-12-28 DIAGNOSIS — I739 Peripheral vascular disease, unspecified: Secondary | ICD-10-CM

## 2015-12-28 DIAGNOSIS — M79609 Pain in unspecified limb: Secondary | ICD-10-CM | POA: Insufficient documentation

## 2015-12-28 DIAGNOSIS — I70219 Atherosclerosis of native arteries of extremities with intermittent claudication, unspecified extremity: Secondary | ICD-10-CM

## 2015-12-28 DIAGNOSIS — M79606 Pain in leg, unspecified: Secondary | ICD-10-CM

## 2015-12-28 NOTE — Progress Notes (Signed)
MRN : [id number]  Katie Cunningham is a 82 y.o. (1931/08/13) female who presents with chief complaint of  Chief Complaint  Patient presents with  . Follow-up    Ultrasound  .  History of Present Illness:  The patient returns to the office for followup and review of the noninvasive studies. She is s/p bilateral common iliac artery stents on November 07, 2015.  Following the procedure she notes her walking is better but she is still c/o "pins and needles" in the right foot and she notes that the right leg swells.  No interval shortening of the patient's claudication distance.  No development of rest pain symptoms. No new ulcers or wounds have occurred since the last visit.  There have been no significant changes to the patient's overall health care.  The patient denies amaurosis fugax or recent TIA symptoms. There are no recent neurological changes noted. The patient denies history of DVT, PE or superficial thrombophlebitis. The patient denies recent episodes of angina or shortness of breath.    ABI's Rt=1.11 and Lt=1.06  (previous study showed Rt=0.82 and Lt=0.91) Duplex ultrasound of the patent iliac stent bilaterally with triphasic flow   Current Outpatient Prescriptions  Medication Sig Dispense Refill  . acetaminophen (TYLENOL) 325 MG tablet Take 650 mg by mouth every 6 (six) hours as needed.    Marland Kitchen amLODipine (NORVASC) 10 MG tablet Take 10 mg by mouth daily.    Marland Kitchen aspirin 81 MG chewable tablet Chew by mouth daily.    . cholecalciferol (VITAMIN D) 400 units TABS tablet Take 400 Units by mouth daily.    . clopidogrel (PLAVIX) 75 MG tablet Take 1 tablet (75 mg total) by mouth daily. 30 tablet 5  . co-enzyme Q-10 30 MG capsule Take 30 mg by mouth 3 (three) times daily.    Marland Kitchen gabapentin (NEURONTIN) 300 MG capsule Take 300 mg by mouth 4 (four) times daily.    . metFORMIN (GLUCOPHAGE) 500 MG tablet Take by mouth 2 (two) times daily with a meal.    . metoprolol succinate (TOPROL-XL) 100 MG  24 hr tablet Take 100 mg by mouth daily. Take with or immediately following a meal.    . Multiple Vitamin (MULTIVITAMIN) tablet Take 1 tablet by mouth daily.    Marland Kitchen omeprazole (PRILOSEC) 40 MG capsule Take 40 mg by mouth daily.    . pravastatin (PRAVACHOL) 20 MG tablet Take 20 mg by mouth daily.    . vitamin B-12 (CYANOCOBALAMIN) 250 MCG tablet Take 250 mcg by mouth daily.    Marland Kitchen oxyCODONE (ROXICODONE) 5 MG immediate release tablet Take 1 tablet (5 mg total) by mouth every 8 (eight) hours as needed for severe pain. (Patient not taking: Reported on 12/28/2015) 6 tablet 0   No current facility-administered medications for this visit.     Past Medical History:  Diagnosis Date  . Arthritis   . Diabetes mellitus without complication ([place])   . GERD (gastroesophageal reflux disease)   . Hyperlipidemia   . Hypertension   . Peripheral neuropathy ([place])   . Peripheral vascular disease [facility])     Past Surgical History:  Procedure Laterality Date  . APPENDECTOMY    . BREAST BIOPSY Left    neg  . PERIPHERAL VASCULAR CATHETERIZATION Right 11/07/2015   Procedure: Lower Extremity Angiography;  Surgeon: Katha Cabal, MD;  Location: [place] [facility];  Service: Cardiovascular;  Laterality: Right;  . TONSILLECTOMY      Social History Social History  Substance  Use Topics  . Smoking status: Former Smoker    Packs/day: 1.00    Years: 39.00    Types: Cigarettes  . Smokeless tobacco: Never Used  . Alcohol use 1.2 oz/week    2 Glasses of wine per week    Family History Family History  Problem Relation Age of Onset  . Breast cancer Sister 32     No Known Allergies   REVIEW OF SYSTEMS (Negative unless checked)  Constitutional: [] Weight loss  [] Fever  [] Chills Cardiac: [] Chest pain   [] Chest pressure   [] Palpitations   [] Shortness of breath at rest   [] Shortness of breath with exertion. Vascular:  [] Pain in legs with walking   [] Pain in legs at rest    [] Pain in feet at rest     [] History of DVT   [] Phlebitis   [] Swelling in legs   [] Varicose veins   [] Non-healing ulcers Pulmonary:   [] Uses home oxygen   [] Productive cough   [] Hemoptysis   [] Wheeze  [] COPD   [] Asthma Neurologic:  [] Dizziness  [] Blackouts   [] Seizures   [] History of stroke   [] History of TIA  [] Aphasia   [] Temporary blindness   [] Dysphagia   [] Weakness or numbness in arm   [] Weakness or numbness in leg Musculoskeletal:  [] Arthritis   [] Joint swelling   [] Joint pain   [] Low back pain Hematologic:  [] Easy bruising  [] Easy bleeding   [] Hypercoagulable state   [] Anemic   Gastrointestinal:  [] Blood in stool   [] Vomiting blood  [] Gastroesophageal reflux/heartburn   [] Difficulty swallowing. Genitourinary:  [] Chronic kidney disease   [] Difficult urination  [] Frequent urination  [] Burning with urination   [] Blood in urine Skin:  [] Rashes   [] Ulcers   Psychological:  [] History of anxiety   []  History of major depression.    Physical Examination  Vitals:   12/28/15 1123  BP: (!) 146/73  Pulse: 92  Resp: 16  Weight: 161 lb (73 kg)  Height: 5\' 5"  (1.651 m)   Body mass index is 26.79 kg/m. Gen:  WD/WN, NAD Head: [place]/AT, No temporalis wasting. Ear/Nose/Throat: Hearing grossly intact, nares w/o erythema or drainage, trachea midline Eyes: PERR, EOM appear normal. Sclera non-icteric Neck: Supple, no nuchal rigidity.  No bruit or JVD.  Pulmonary:  Good air movement, equal and clear to auscultation bilaterally.  Cardiac: RRR, normal S1, S2, no Murmurs, rubs or gallops. Vascular:   2+ edema pitting on the right Vessel Right Left  Radial Palpable Palpable  Ulnar Palpable Palpable  Brachial Palpable Palpable  Carotid Palpable Palpable  Aorta Not palpable N/A  Femoral Palpable Palpable  Popliteal Palpable Palpable  PT Palpable Palpable  DP Palpable Palpable   Gastrointestinal: soft, non-rigid/non-distended. No guarding.  Musculoskeletal: M/S 5/5 throughout.  No deformity or atrophy. Neurologic: CN 2-12  intact. Pain and light touch intact in extremities.  Speech is fluent. Motor exam as listed above. Psychiatric: Judgment intact, Mood & affect appropriate for pt's clinical situation. Dermatologic: No rashes or ulcers noted.  No signs consistent with cellulitis. Lymphatic:  No cervical lymphadenopathy  CBC No results found for: WBC, HGB, HCT, MCV, PLT  BMET    Component Value Date/Time   NA 138 08/01/2015 1639   K 4.3 08/01/2015 1639   CL 104 08/01/2015 1639   CO2 26 08/01/2015 1639   GLUCOSE 159 (H) 08/01/2015 1639   BUN 20 11/06/2015 1133   CREATININE 0.97 11/06/2015 1133   CALCIUM 9.8 08/01/2015 1639   GFRNONAA 53 (L) 11/06/2015 1133   GFRAA >  60 11/06/2015 1133   CrCl cannot be calculated (Patient's most recent lab result is older than the maximum 21 days allowed.).  COAG No results found for: INR, PROTIME  Radiology No results found.    Assessment/Plan 1. 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  The patient does not voice lifestyle limiting changes at this point in time.  Noninvasive studies do not suggest clinically significant change.  Intervention in August was successful.  No invasive studies, angiography or surgery at this time The patient should continue walking and begin a more formal exercise program.  The patient should continue antiplatelet therapy and aggressive treatment of the lipid abnormalities  No changes in the patient's medications at this time  The patient should continue wearing graduated compression socks 10-15 mmHg strength to control the mild edema.   - VAS [place] ABI WITH/WO TBI; Future  2. Pain in both lower extremities See above  3. Neuropathy of foot, right Consider neurontin  4. Essential hypertension Continue current medications no changes  5. Mixed hyperlipidemia Continue  statin    Hortencia Pilar, MD  12/28/2015 12:37 PM    This note was created with Dragon medical transcription system.  Any errors from dictation are purely unintentional

## 2016-04-29 ENCOUNTER — Telehealth (INDEPENDENT_AMBULATORY_CARE_PROVIDER_SITE_OTHER): Payer: Self-pay | Admitting: Vascular Surgery

## 2016-04-29 NOTE — Telephone Encounter (Signed)
Pt lvm and wanted to know does she need to get a refill on her clopidogrel since she has ran out. Please call pt [phone]

## 2016-05-02 ENCOUNTER — Other Ambulatory Visit (INDEPENDENT_AMBULATORY_CARE_PROVIDER_SITE_OTHER): Payer: Self-pay

## 2016-05-08 ENCOUNTER — Other Ambulatory Visit: Payer: Self-pay | Admitting: Internal Medicine

## 2016-05-08 DIAGNOSIS — Z1231 Encounter for screening mammogram for malignant neoplasm of breast: Secondary | ICD-10-CM

## 2016-05-27 ENCOUNTER — Ambulatory Visit
Admission: RE | Admit: 2016-05-27 | Discharge: 2016-05-27 | Disposition: A | Discharge status: Home / Self Care | Payer: Medicare PPO | Source: Ambulatory Visit | Attending: Internal Medicine | Admitting: Internal Medicine

## 2016-05-27 DIAGNOSIS — Z1231 Encounter for screening mammogram for malignant neoplasm of breast: Secondary | ICD-10-CM | POA: Insufficient documentation

## 2016-06-27 ENCOUNTER — Encounter (INDEPENDENT_AMBULATORY_CARE_PROVIDER_SITE_OTHER): Payer: Medicare PPO

## 2016-06-27 ENCOUNTER — Ambulatory Visit (INDEPENDENT_AMBULATORY_CARE_PROVIDER_SITE_OTHER): Payer: Medicare PPO | Admitting: Vascular Surgery

## 2016-07-04 ENCOUNTER — Encounter (INDEPENDENT_AMBULATORY_CARE_PROVIDER_SITE_OTHER): Payer: Self-pay | Admitting: Vascular Surgery

## 2016-07-04 ENCOUNTER — Ambulatory Visit (INDEPENDENT_AMBULATORY_CARE_PROVIDER_SITE_OTHER): Payer: Medicare PPO | Admitting: Vascular Surgery

## 2016-07-04 ENCOUNTER — Ambulatory Visit (INDEPENDENT_AMBULATORY_CARE_PROVIDER_SITE_OTHER): Payer: Medicare PPO

## 2016-07-04 VITALS — BP 142/73 | HR 86 | Resp 16 | Wt 151.0 lb

## 2016-07-04 DIAGNOSIS — I70213 Atherosclerosis of native arteries of extremities with intermittent claudication, bilateral legs: Secondary | ICD-10-CM

## 2016-07-04 DIAGNOSIS — M79604 Pain in right leg: Secondary | ICD-10-CM

## 2016-07-04 DIAGNOSIS — I10 Essential (primary) hypertension: Secondary | ICD-10-CM

## 2016-07-04 DIAGNOSIS — E782 Mixed hyperlipidemia: Secondary | ICD-10-CM

## 2016-07-04 DIAGNOSIS — M79605 Pain in left leg: Secondary | ICD-10-CM

## 2016-07-04 NOTE — Progress Notes (Signed)
MRN : [id number]  Katie Cunningham is a 83 y.o. (1931/04/21) female who presents with chief complaint of  Chief Complaint  Patient presents with  . Follow-up  .  History of Present Illness: The patient returns to the office for followup and review of the noninvasive studies. She is s/p bilateral iliac stents in August 2017.    There have been no interval changes in lower extremity symptoms. No interval shortening of the patient's claudication distance or development of rest pain symptoms. No new ulcers or wounds have occurred since the last visit.  There have been no significant changes to the patient's overall health care.  The patient denies amaurosis fugax or recent TIA symptoms. There are no recent neurological changes noted. The patient denies history of DVT, PE or superficial thrombophlebitis. The patient denies recent episodes of angina or shortness of breath.   ABI Rt=1.10 and Lt=1.00  (previous ABI's Rt=1.11 and Lt=1.06)   Current Meds  Medication Sig  . acetaminophen (TYLENOL) 325 MG tablet Take 650 mg by mouth every 6 (six) hours as needed.  Marland Kitchen amLODipine (NORVASC) 10 MG tablet Take 10 mg by mouth daily.  Marland Kitchen aspirin 81 MG chewable tablet Chew by mouth daily.  . clopidogrel (PLAVIX) 75 MG tablet Take 1 tablet (75 mg total) by mouth daily.  Marland Kitchen co-enzyme Q-10 30 MG capsule Take 30 mg by mouth 3 (three) times daily.  . Cyanocobalamin (VITAMIN B-12) 1000 MCG SUBL Take by mouth.  Marland Kitchen LANTUS SOLOSTAR 100 UNIT/ML Solostar Pen 14 Units at bedtime.   . metFORMIN (GLUCOPHAGE) 500 MG tablet Take by mouth 2 (two) times daily with a meal.  . metoprolol succinate (TOPROL-XL) 100 MG 24 hr tablet Take 100 mg by mouth daily. Take with or immediately following a meal.  . Multiple Vitamin (MULTIVITAMIN) tablet Take 1 tablet by mouth daily.  Marland Kitchen NOVOLOG FLEXPEN 100 UNIT/ML FlexPen 4 Units 3 (three) times daily with meals.   Marland Kitchen omeprazole (PRILOSEC) 40 MG capsule Take 40 mg by mouth daily.  .  rosuvastatin (CRESTOR) 10 MG tablet TAKE ONE TABLET BY MOUTH ONCE DAILY    Past Medical History:  Diagnosis Date  . Arthritis   . 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 Diabetes mellitus without complication ([place])   . GERD (gastroesophageal reflux disease)   . Hyperlipidemia   . Hypertension   . Peripheral neuropathy   . Peripheral vascular disease [facility])     Past Surgical History:  Procedure Laterality Date  . APPENDECTOMY    . BREAST BIOPSY Left    neg  . PERIPHERAL VASCULAR CATHETERIZATION Right 11/07/2015   Procedure: Lower Extremity Angiography;  Surgeon: Katha Cabal, MD;  Location: [place] [facility];  Service: Cardiovascular;  Laterality: Right;  . TONSILLECTOMY      Social History Social History  Substance Use Topics  . Smoking status: Former Smoker    Packs/day: 1.00    Years: 39.00    Types: Cigarettes  . Smokeless tobacco: Never Used  . Alcohol use 1.2 oz/week    2 Glasses of wine per week    Family History Family History  Problem Relation Age of Onset  . Breast cancer Sister 35    Allergies  Allergen Reactions  . Atorvastatin Other (See Comments)  . Colestipol Other (See Comments)    Heartburn     REVIEW OF SYSTEMS (Negative unless checked)  Constitutional: [] Weight loss  [] Fever  [] Chills Cardiac: [] Chest  pain   [] Chest pressure   [] Palpitations   [] Shortness of breath when laying flat   [] Shortness of breath with exertion. Vascular:  [] Pain in legs with walking   [] Pain in legs at rest  [] History of DVT   [] Phlebitis   [] Swelling in legs   [] Varicose veins   [] Non-healing ulcers Pulmonary:   [] Uses home oxygen   [] Productive cough   [] Hemoptysis   [] Wheeze  [] COPD   [] Asthma Neurologic:  [] Dizziness   [] Seizures   [] History of stroke   [] History of TIA  [] Aphasia   [] Vissual changes   [] Weakness or numbness in arm   [] Weakness or numbness in leg Musculoskeletal:   [] Joint swelling   [] Joint pain   [] Low back  pain Hematologic:  [] Easy bruising  [] Easy bleeding   [] Hypercoagulable state   [] Anemic Gastrointestinal:  [] Diarrhea   [] Vomiting  [] Gastroesophageal reflux/heartburn   [] Difficulty swallowing. Genitourinary:  [] Chronic kidney disease   [] Difficult urination  [] Frequent urination   [] Blood in urine Skin:  [] Rashes   [] Ulcers  Psychological:  [] History of anxiety   []  History of major depression.  Physical Examination  Vitals:   07/04/16 1005  BP: (!) 142/73  Pulse: 86  Resp: 16  Weight: 68.5 kg (151 lb)   Body mass index is 25.13 kg/m. Gen: WD/WN, NAD Head: [place]/AT, No temporalis wasting.  Ear/Nose/Throat: Hearing grossly intact, nares w/o erythema or drainage Eyes: PER, EOMI, sclera nonicteric.  Neck: Supple, no large masses.   Pulmonary:  Good air movement, no audible wheezing bilaterally, no use of accessory muscles.  Cardiac: RRR, no JVD Vascular:  Feet pink and warm with rapid cap refill Vessel Right Left  Radial Palpable Palpable  Ulnar Palpable Palpable  Brachial Palpable Palpable  Carotid Palpable Palpable  Femoral Palpable Palpable  Popliteal Palpable Palpable  PT Palpable Palpable  DP Palpable Palpable  Gastrointestinal: Non-distended. No guarding/no peritoneal signs.  Musculoskeletal: M/S 5/5 throughout.  No deformity or atrophy.  Neurologic: CN 2-12 intact. Symmetrical.  Speech is fluent. Motor exam as listed above. Psychiatric: Judgment intact, Mood & affect appropriate for pt's clinical situation. Dermatologic: No rashes or ulcers noted.  No changes consistent with cellulitis. Lymph : No lichenification or skin changes of chronic lymphedema.  CBC No results found for: WBC, HGB, HCT, MCV, PLT  BMET    Component Value Date/Time   NA 138 08/01/2015 1639   K 4.3 08/01/2015 1639   CL 104 08/01/2015 1639   CO2 26 08/01/2015 1639   GLUCOSE 159 (H) 08/01/2015 1639   BUN 20 11/06/2015 1133   CREATININE 0.97 11/06/2015 1133   CALCIUM 9.8 08/01/2015 1639    GFRNONAA 53 (L) 11/06/2015 1133   GFRAA >60 11/06/2015 1133   CrCl cannot be calculated (Patient's most recent lab result is older than the maximum 21 days allowed.).  COAG No results found for: INR, PROTIME  Radiology No results found.  Assessment/Plan 1. 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  The patient does not voice lifestyle limiting changes at this point in time.  Noninvasive studies do not suggest clinically significant change.  No invasive studies, angiography or surgery at this time The patient should continue walking and begin a more formal exercise program.  The patient should continue antiplatelet therapy and aggressive treatment of the lipid abnormalities  No changes in the patient's medications at this time  The  patient should continue wearing graduated compression socks 10-15 mmHg strength to control the mild edema.   2. Pain in both lower extremities Essentially resolved since her intervention.  Occasionally she gets a left leg pain with walking.  3. Mixed hyperlipidemia Continue statin as ordered and reviewed, no changes at this time   4. Essential hypertension Continue antihypertensive medications as already ordered, these medications have been reviewed and there are no changes at this time.    Hortencia Pilar, MD  07/04/2016 10:52 AM

## 2016-10-14 ENCOUNTER — Encounter (INDEPENDENT_AMBULATORY_CARE_PROVIDER_SITE_OTHER): Payer: Self-pay | Admitting: Vascular Surgery

## 2016-10-14 ENCOUNTER — Ambulatory Visit (INDEPENDENT_AMBULATORY_CARE_PROVIDER_SITE_OTHER): Payer: Medicare PPO | Admitting: Vascular Surgery

## 2016-10-14 VITALS — BP 121/76 | HR 96 | Resp 16 | Wt 150.6 lb

## 2016-10-14 DIAGNOSIS — M79604 Pain in right leg: Secondary | ICD-10-CM | POA: Diagnosis not present

## 2016-10-14 DIAGNOSIS — M1990 Unspecified osteoarthritis, unspecified site: Secondary | ICD-10-CM | POA: Insufficient documentation

## 2016-10-14 DIAGNOSIS — E782 Mixed hyperlipidemia: Secondary | ICD-10-CM | POA: Diagnosis not present

## 2016-10-14 DIAGNOSIS — G5791 Unspecified mononeuropathy of right lower limb: Secondary | ICD-10-CM

## 2016-10-14 DIAGNOSIS — M79605 Pain in left leg: Secondary | ICD-10-CM

## 2016-10-14 DIAGNOSIS — I70213 Atherosclerosis of native arteries of extremities with intermittent claudication, bilateral legs: Secondary | ICD-10-CM | POA: Diagnosis not present

## 2016-10-14 DIAGNOSIS — M170 Bilateral primary osteoarthritis of knee: Secondary | ICD-10-CM | POA: Diagnosis not present

## 2016-10-14 NOTE — Progress Notes (Signed)
MRN : [id number]  Katie Cunningham is a 83 y.o. (03-May-1931) female who presents with chief complaint of  Chief Complaint  Patient presents with  . Leg Pain  .  History of Present Illness: The patient returns to the office for followup and review of the noninvasive studies. She is s/p bilateral iliac stents in August 2017.    She is now complaining of pain in the right leg primarily in the knee area. She notes that it is predominantly in the posterior aspect and has a radiating or shooting characteristic. It is related to activity. No association with swelling. No recent traumas or injuries to the knee. She has an extensive history of degenerative joint disease of both knees with the right being significantly more affected and has been told that she needs a right total knee replacement in the past.   There have been no significant changes to the patient's overall health care.  The patient denies amaurosis fugax or recent TIA symptoms. There are no recent neurological changes noted. The patient denies history of DVT, PE or superficial thrombophlebitis. The patient denies recent episodes of angina or shortness of breath.   previous ABI's Rt=1.11 and Lt=1.06  Current Meds  Medication Sig  . acetaminophen (TYLENOL) 325 MG tablet Take 650 mg by mouth every 6 (six) hours as needed.  Marland Kitchen amLODipine (NORVASC) 10 MG tablet Take 10 mg by mouth daily.  Marland Kitchen aspirin 81 MG chewable tablet Chew by mouth daily.  . cholecalciferol (VITAMIN D) 400 units TABS tablet Take 1,000 Units by mouth 2 (two) times daily.   Marland Kitchen co-enzyme Q-10 30 MG capsule Take 30 mg by mouth 3 (three) times daily.  . Cyanocobalamin (VITAMIN B-12) 1000 MCG SUBL Take by mouth.  Marland Kitchen LANTUS SOLOSTAR 100 UNIT/ML Solostar Pen 14 Units at bedtime.   . metFORMIN (GLUCOPHAGE) 500 MG tablet Take by mouth 2 (two) times daily with a meal.  . metoprolol succinate (TOPROL-XL) 100 MG 24 hr tablet Take 100 mg by mouth daily. Take with or  immediately following a meal.  . Multiple Vitamin (MULTIVITAMIN) tablet Take 1 tablet by mouth daily.  Marland Kitchen NOVOLOG FLEXPEN 100 UNIT/ML FlexPen 4 Units 3 (three) times daily with meals.   Marland Kitchen omeprazole (PRILOSEC) 40 MG capsule Take 40 mg by mouth daily.  . rosuvastatin (CRESTOR) 10 MG tablet TAKE ONE TABLET BY MOUTH ONCE DAILY  . vitamin B-12 (CYANOCOBALAMIN) 250 MCG tablet Take 250 mcg by mouth daily.    Past Medical History:  Diagnosis Date  . Arthritis   . 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 Diabetes mellitus without complication ([place])   . GERD (gastroesophageal reflux disease)   . Hyperlipidemia   . Hypertension   . Peripheral neuropathy   . Peripheral vascular disease [facility])     Past Surgical History:  Procedure Laterality Date  . APPENDECTOMY    . BREAST BIOPSY Left    neg  . PERIPHERAL VASCULAR CATHETERIZATION Right 11/07/2015   Procedure: Lower Extremity Angiography;  Surgeon: Katha Cabal, MD;  Location: [place] [facility];  Service: Cardiovascular;  Laterality: Right;  . TONSILLECTOMY      Social History Social History  Substance Use Topics  . Smoking status: Former Smoker    Packs/day: 1.00    Years: 39.00    Types: Cigarettes  . Smokeless tobacco: Never Used  . Alcohol use 1.2 oz/week    2 Glasses of wine per week  Family History Family History  Problem Relation Age of Onset  . Breast cancer Sister 42    Allergies  Allergen Reactions  . Atorvastatin Other (See Comments)  . Colestipol Other (See Comments)    Heartburn     REVIEW OF SYSTEMS (Negative unless checked)  Constitutional: [] Weight loss  [] Fever  [] Chills Cardiac: [] Chest pain   [] Chest pressure   [] Palpitations   [] Shortness of breath when laying flat   [] Shortness of breath with exertion. Vascular:  [x] Pain in legs with walking   [] Pain in legs at rest  [] History of DVT   [] Phlebitis   [] Swelling in legs   [] Varicose veins   [] Non-healing ulcers Pulmonary:    [] Uses home oxygen   [] Productive cough   [] Hemoptysis   [] Wheeze  [] COPD   [] Asthma Neurologic:  [] Dizziness   [] Seizures   [] History of stroke   [] History of TIA  [] Aphasia   [] Vissual changes   [] Weakness or numbness in arm   [] Weakness or numbness in leg Musculoskeletal:   [] Joint swelling   [x] Joint pain   [x] Low back pain Hematologic:  [] Easy bruising  [] Easy bleeding   [] Hypercoagulable state   [] Anemic Gastrointestinal:  [] Diarrhea   [] Vomiting  [] Gastroesophageal reflux/heartburn   [] Difficulty swallowing. Genitourinary:  [] Chronic kidney disease   [] Difficult urination  [] Frequent urination   [] Blood in urine Skin:  [] Rashes   [] Ulcers  Psychological:  [] History of anxiety   []  History of major depression.  Physical Examination  Vitals:   10/14/16 1540  BP: 121/76  Pulse: 96  Resp: 16  Weight: 150 lb 9.6 oz (68.3 kg)   Body mass index is 25.06 kg/m. Gen: WD/WN, NAD Head: [place]/AT, No temporalis wasting.  Ear/Nose/Throat: Hearing grossly intact, nares w/o erythema or drainage Eyes: PER, EOMI, sclera nonicteric.  Neck: Supple, no large masses.   Pulmonary:  Good air movement, no audible wheezing bilaterally, no use of accessory muscles.  Cardiac: RRR, no JVD Vascular: Both feet are pink and warm with brisk capillary refill Vessel Right Left  Radial Palpable Palpable  PT 1+ Palpable 1+ Palpable  DP 1+ Palpable 1+ Palpable  Gastrointestinal: Non-distended. No guarding/no peritoneal signs.  Musculoskeletal: M/S 5/5 throughout.  Marked arthritic deformity both knees right significantly more than the left Neurologic: CN 2-12 intact. Symmetrical.  Speech is fluent. Motor exam as listed above. Psychiatric: Judgment intact, Mood & affect appropriate for pt's clinical situation. Dermatologic: No rashes or ulcers noted.  No changes consistent with cellulitis. Lymph : No lichenification or skin changes of chronic lymphedema.  CBC No results found for: WBC, HGB, HCT, MCV,  PLT  BMET    Component Value Date/Time   NA 138 08/01/2015 1639   K 4.3 08/01/2015 1639   CL 104 08/01/2015 1639   CO2 26 08/01/2015 1639   GLUCOSE 159 (H) 08/01/2015 1639   BUN 20 11/06/2015 1133   CREATININE 0.97 11/06/2015 1133   CALCIUM 9.8 08/01/2015 1639   GFRNONAA 53 (L) 11/06/2015 1133   GFRAA >60 11/06/2015 1133   CrCl cannot be calculated (Patient's most recent lab result is older than the maximum 21 days allowed.).  COAG No results found for: INR, PROTIME  Radiology No results found.  Assessment/Plan 1. Pain in both lower extremities Recommend:  Patient should undergo arterial duplex of the lower extremity because there has been a significant deterioration in the patient's right lower extremity symptoms.  The patient states they are having increased pain and a marked decrease in the distance that  they can walk.  The risks and benefits as well as the alternatives were discussed in detail with the patient.  All questions were answered.  Patient agrees to proceed and understands this could be a prelude to angiography and intervention.  The patient will follow up with me in the office to review the studies.  - Ambulatory referral to Orthopedic Surgery  - VAS US AORTA/IVC/ILIACS; Future - VAS [place] ABI WITH/WO TBI; Future - VAS [place] LOWER EXTREMITY ARTERIAL DUPLEX; Future  2. Atherosclerosis of native artery of both lower extremities with intermittent claudication ([place]) See #1 - VAS US AORTA/IVC/ILIACS; Future - VAS [place] ABI WITH/WO TBI; Future - VAS [place] LOWER EXTREMITY ARTERIAL DUPLEX; Future  3. Primary osteoarthritis of both knees Given the patient's description as well as her examination with the rather dramatic arthritic changes to her right knee in association with palpable pedal pulses I am suspicious this represents degenerative changes and not a change in her vascular status.  I will ask Dr. Jefm Bryant to see her again.  - Ambulatory referral to Orthopedic  Surgery  4. Neuropathy of foot, right Continue gabapentin, excellent diabetic control was again stressed  5. Mixed hyperlipidemia Continue statin as ordered and reviewed, no changes at this time    Hortencia Pilar, MD  10/14/2016 3:53 PM

## 2016-10-29 DIAGNOSIS — S46219A Strain of muscle, fascia and tendon of other parts of biceps, unspecified arm, initial encounter: Secondary | ICD-10-CM | POA: Insufficient documentation

## 2016-10-29 DIAGNOSIS — M25511 Pain in right shoulder: Secondary | ICD-10-CM | POA: Insufficient documentation

## 2016-11-26 DIAGNOSIS — M1711 Unilateral primary osteoarthritis, right knee: Secondary | ICD-10-CM | POA: Insufficient documentation

## 2016-11-26 DIAGNOSIS — M25561 Pain in right knee: Secondary | ICD-10-CM | POA: Insufficient documentation

## 2016-11-26 DIAGNOSIS — M7541 Impingement syndrome of right shoulder: Secondary | ICD-10-CM | POA: Insufficient documentation

## 2016-12-12 ENCOUNTER — Encounter (INDEPENDENT_AMBULATORY_CARE_PROVIDER_SITE_OTHER): Payer: Medicare PPO

## 2016-12-12 ENCOUNTER — Ambulatory Visit (INDEPENDENT_AMBULATORY_CARE_PROVIDER_SITE_OTHER): Payer: Medicare PPO | Admitting: Vascular Surgery

## 2016-12-12 ENCOUNTER — Encounter (INDEPENDENT_AMBULATORY_CARE_PROVIDER_SITE_OTHER): Payer: Self-pay

## 2016-12-12 ENCOUNTER — Other Ambulatory Visit (INDEPENDENT_AMBULATORY_CARE_PROVIDER_SITE_OTHER): Payer: Medicare PPO

## 2016-12-12 ENCOUNTER — Encounter (INDEPENDENT_AMBULATORY_CARE_PROVIDER_SITE_OTHER): Payer: Self-pay | Admitting: Vascular Surgery

## 2016-12-12 VITALS — BP 128/70 | HR 89 | Resp 16 | Ht 63.0 in | Wt 154.0 lb

## 2016-12-12 DIAGNOSIS — E782 Mixed hyperlipidemia: Secondary | ICD-10-CM

## 2016-12-12 DIAGNOSIS — M79605 Pain in left leg: Secondary | ICD-10-CM

## 2016-12-12 DIAGNOSIS — I10 Essential (primary) hypertension: Secondary | ICD-10-CM | POA: Diagnosis not present

## 2016-12-12 DIAGNOSIS — M79604 Pain in right leg: Secondary | ICD-10-CM

## 2016-12-12 DIAGNOSIS — I70213 Atherosclerosis of native arteries of extremities with intermittent claudication, bilateral legs: Secondary | ICD-10-CM

## 2016-12-12 DIAGNOSIS — M170 Bilateral primary osteoarthritis of knee: Secondary | ICD-10-CM

## 2016-12-12 NOTE — Progress Notes (Signed)
MRN : [id number]  Katie Cunningham is a 83 y.o. ([birth year]) female who presents with chief complaint of  Chief Complaint  Patient presents with  . Follow-up    pt. conv. aorta iliac, abi, renal artery dup.  Marland Kitchen  History of Present Illness: The patient returns to the office for followup and review of the noninvasive studies. There have been no interval changes in lower extremity symptoms. No interval shortening of the patient's claudication distance or development of rest pain symptoms. No new ulcers or wounds have occurred since the last visit.  There have been no significant changes to the patient's overall health care.  The patient denies amaurosis fugax or recent TIA symptoms. There are no recent neurological changes noted. The patient denies history of DVT, PE or superficial thrombophlebitis. The patient denies recent episodes of angina or shortness of breath.   ABI Rt=[place] and Lt=[place] triphasic tibial signal bilaterally (previous ABI's Rt=1.10 and Lt=1.00) Duplex ultrasound of the aorta iliac shows the stents are patent no velocity shifts are found  No outpatient prescriptions have been marked as taking for the 12/12/16 encounter (Office Visit) with Delana Meyer, Dolores Lory, MD.    Past Medical History:  Diagnosis Date  . Arthritis   . 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 Diabetes mellitus without complication ([place])   . GERD (gastroesophageal reflux disease)   . Hyperlipidemia   . Hypertension   . Peripheral neuropathy   . Peripheral vascular disease [facility])     Past Surgical History:  Procedure Laterality Date  . APPENDECTOMY    . BREAST BIOPSY Left    neg  . PERIPHERAL VASCULAR CATHETERIZATION Right 11/07/2015   Procedure: Lower Extremity Angiography;  Surgeon: Katha Cabal, MD;  Location: [place] [facility];  Service: Cardiovascular;  Laterality: Right;  . TONSILLECTOMY      Social History Social History  Substance Use Topics  . Smoking  status: Former Smoker    Packs/day: 1.00    Years: 39.00    Types: Cigarettes  . Smokeless tobacco: Never Used  . Alcohol use 1.2 oz/week    2 Glasses of wine per week    Family History Family History  Problem Relation Age of Onset  . Breast cancer Sister 20    Allergies  Allergen Reactions  . Atorvastatin Other (See Comments)  . Colestipol Other (See Comments)    Heartburn     REVIEW OF SYSTEMS (Negative unless checked)  Constitutional: [] Weight loss  [] Fever  [] Chills Cardiac: [] Chest pain   [] Chest pressure   [] Palpitations   [] Shortness of breath when laying flat   [] Shortness of breath with exertion. Vascular:  [x] Pain in legs with walking   [x] Pain in legs at rest  [] History of DVT   [] Phlebitis   [] Swelling in legs   [] Varicose veins   [] Non-healing ulcers Pulmonary:   [] Uses home oxygen   [] Productive cough   [] Hemoptysis   [] Wheeze  [] COPD   [] Asthma Neurologic:  [] Dizziness   [] Seizures   [] History of stroke   [] History of TIA  [] Aphasia   [] Vissual changes   [] Weakness or numbness in arm   [] Weakness or numbness in leg Musculoskeletal:   [x] Joint swelling   [x] Joint pain   [x] Low back pain Hematologic:  [] Easy bruising  [] Easy bleeding   [] Hypercoagulable state   [] Anemic Gastrointestinal:  [] Diarrhea   [] Vomiting  [] Gastroesophageal reflux/heartburn   [] Difficulty swallowing. Genitourinary:  [x] Chronic kidney disease   []   Difficult urination  [] Frequent urination   [] Blood in urine Skin:  [] Rashes   [] Ulcers  Psychological:  [] History of anxiety   []  History of major depression.  Physical Examination  Vitals:   12/12/16 0917  BP: 128/70  Pulse: 89  Resp: 16  Weight: 69.9 kg (154 lb)  Height: 5\' 3"  (1.6 m)   Body mass index is 27.28 kg/m. Gen: WD/WN, NAD Head: [place]/AT, No temporalis wasting.  Ear/Nose/Throat: Hearing grossly intact, nares w/o erythema or drainage Eyes: PER, EOMI, sclera nonicteric.  Neck: Supple, no large masses.   Pulmonary:  Good air  movement, no audible wheezing bilaterally, no use of accessory muscles.  Cardiac: RRR, no JVD Vascular:  Vessel Right Left  Radial Palpable Palpable  PT Trace Palpable Not Palpable  DP Trace Palpable Trace Palpable  Gastrointestinal: Non-distended. No guarding/no peritoneal signs.  Musculoskeletal: M/S 5/5 throughout.  No deformity or atrophy.  Neurologic: CN 2-12 intact. Symmetrical.  Speech is fluent. Motor exam as listed above. Psychiatric: Judgment intact, Mood & affect appropriate for pt's clinical situation. Dermatologic: No rashes or ulcers noted.  No changes consistent with cellulitis. Lymph : No lichenification or skin changes of chronic lymphedema.  CBC No results found for: WBC, HGB, HCT, MCV, PLT  BMET    Component Value Date/Time   NA 138 08/01/2015 1639   K 4.3 08/01/2015 1639   CL 104 08/01/2015 1639   CO2 26 08/01/2015 1639   GLUCOSE 159 (H) 08/01/2015 1639   BUN 20 11/06/2015 1133   CREATININE 0.97 11/06/2015 1133   CALCIUM 9.8 08/01/2015 1639   GFRNONAA 53 (L) 11/06/2015 1133   GFRAA >60 11/06/2015 1133   CrCl cannot be calculated (Patient's most recent lab result is older than the maximum 21 days allowed.).  COAG No results found for: INR, PROTIME  Radiology No results found.  Assessment/Plan 1. 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  The patient does not voice lifestyle limiting changes at this point in time.  Noninvasive studies do not suggest clinically significant change.  No invasive studies, angiography or surgery at this time The patient should continue walking and begin a more formal exercise program.  The patient should continue antiplatelet therapy and aggressive treatment of the lipid abnormalities  No changes in the patient's medications at this time  The patient should continue  wearing graduated compression socks 10-15 mmHg strength to control the mild edema.   - VAS US AORTA/IVC/ILIACS; Future - VAS [place] ABI WITH/WO TBI; Future  2. Primary osteoarthritis of both knees Continue Tylenol she can't use NSAID's secondary to renal dysfunction  3. Essential hypertension Continue antihypertensive medications as already ordered, these medications have been reviewed and there are no changes at this time.   4. Mixed hyperlipidemia Continue statin as ordered and reviewed, no changes at this time     Hortencia Pilar, MD  12/12/2016 10:27 AM

## 2017-05-08 ENCOUNTER — Other Ambulatory Visit: Payer: Self-pay | Admitting: Internal Medicine

## 2017-05-08 DIAGNOSIS — Z1231 Encounter for screening mammogram for malignant neoplasm of breast: Secondary | ICD-10-CM

## 2017-05-08 DIAGNOSIS — Z803 Family history of malignant neoplasm of breast: Secondary | ICD-10-CM | POA: Insufficient documentation

## 2017-05-21 DIAGNOSIS — E119 Type 2 diabetes mellitus without complications: Secondary | ICD-10-CM | POA: Insufficient documentation

## 2017-05-28 ENCOUNTER — Encounter (INDEPENDENT_AMBULATORY_CARE_PROVIDER_SITE_OTHER): Payer: Self-pay

## 2017-05-28 ENCOUNTER — Ambulatory Visit
Admission: RE | Admit: 2017-05-28 | Discharge: 2017-05-28 | Disposition: A | Discharge status: Home / Self Care | Payer: Medicare PPO | Source: Ambulatory Visit | Attending: Internal Medicine | Admitting: Internal Medicine

## 2017-05-28 DIAGNOSIS — Z1231 Encounter for screening mammogram for malignant neoplasm of breast: Secondary | ICD-10-CM | POA: Insufficient documentation

## 2017-05-28 DIAGNOSIS — Z803 Family history of malignant neoplasm of breast: Secondary | ICD-10-CM

## 2017-07-07 ENCOUNTER — Encounter (INDEPENDENT_AMBULATORY_CARE_PROVIDER_SITE_OTHER): Payer: Medicare PPO

## 2017-07-07 ENCOUNTER — Ambulatory Visit (INDEPENDENT_AMBULATORY_CARE_PROVIDER_SITE_OTHER): Payer: Medicare PPO | Admitting: Vascular Surgery

## 2017-07-07 ENCOUNTER — Other Ambulatory Visit
Admission: RE | Admit: 2017-07-07 | Discharge: 2017-07-07 | Disposition: A | Discharge status: Home / Self Care | Payer: Medicare PPO | Source: Ambulatory Visit | Attending: Ophthalmology | Admitting: Ophthalmology

## 2017-07-07 DIAGNOSIS — R51 Headache: Secondary | ICD-10-CM | POA: Diagnosis present

## 2017-07-07 LAB — C-REACTIVE PROTEIN: CRP: 1 mg/dL — ABNORMAL HIGH (ref <1.0)

## 2017-07-07 LAB — SEDIMENTATION RATE: SED RATE: 13 mm/h (ref 0–30)

## 2017-12-22 ENCOUNTER — Ambulatory Visit (INDEPENDENT_AMBULATORY_CARE_PROVIDER_SITE_OTHER): Payer: Medicare PPO | Admitting: Vascular Surgery

## 2017-12-22 ENCOUNTER — Encounter (INDEPENDENT_AMBULATORY_CARE_PROVIDER_SITE_OTHER): Payer: Medicare PPO

## 2017-12-25 ENCOUNTER — Ambulatory Visit (INDEPENDENT_AMBULATORY_CARE_PROVIDER_SITE_OTHER): Payer: Medicare PPO

## 2017-12-25 ENCOUNTER — Encounter (INDEPENDENT_AMBULATORY_CARE_PROVIDER_SITE_OTHER): Payer: Self-pay | Admitting: Vascular Surgery

## 2017-12-25 ENCOUNTER — Ambulatory Visit (INDEPENDENT_AMBULATORY_CARE_PROVIDER_SITE_OTHER): Payer: Medicare PPO | Admitting: Vascular Surgery

## 2017-12-25 VITALS — BP 120/64 | HR 88 | Resp 16 | Ht 65.0 in | Wt 143.6 lb

## 2017-12-25 DIAGNOSIS — E782 Mixed hyperlipidemia: Secondary | ICD-10-CM | POA: Diagnosis not present

## 2017-12-25 DIAGNOSIS — I10 Essential (primary) hypertension: Secondary | ICD-10-CM | POA: Diagnosis not present

## 2017-12-25 DIAGNOSIS — I70213 Atherosclerosis of native arteries of extremities with intermittent claudication, bilateral legs: Secondary | ICD-10-CM

## 2017-12-25 DIAGNOSIS — M170 Bilateral primary osteoarthritis of knee: Secondary | ICD-10-CM | POA: Diagnosis not present

## 2017-12-25 NOTE — Progress Notes (Signed)
MRN : [id number]  Katie Cunningham is a 84 y.o. (May 05, 1931) female who presents with chief complaint of  Chief Complaint  Patient presents with  . Follow-up    75yr aorta iliac and abi  .  History of Present Illness:   The patient returns to the office for followup and review of the noninvasive studies. There have been no interval changes in lower extremity symptoms. No interval shortening of the patient's claudication distance or development of rest pain symptoms. No new ulcers or wounds have occurred since the last visit.  There have been no significant changes to the patient's overall health care.  The patient denies amaurosis fugax or recent TIA symptoms. There are no recent neurological changes noted. The patient denies history of DVT, PE or superficial thrombophlebitis. The patient denies recent episodes of angina or shortness of breath.   ABI Rt=1.04 and Lt=1.13 triphasic tibial signal bilaterally (previous ABI's Rt=1.10 and Lt=1.00) Duplex ultrasound of the aorta iliac shows the stents are patent no velocity shifts are found  Current Meds  Medication Sig  . acetaminophen (TYLENOL) 325 MG tablet Take 650 mg by mouth every 6 (six) hours as needed.  Marland Kitchen amLODipine (NORVASC) 10 MG tablet Take 10 mg by mouth daily.  Marland Kitchen aspirin 81 MG chewable tablet Chew by mouth daily.  . cholecalciferol (VITAMIN D) 400 units TABS tablet Take 1,000 Units by mouth 2 (two) times daily.   Marland Kitchen co-enzyme Q-10 30 MG capsule Take 30 mg by mouth 3 (three) times daily.  . Cyanocobalamin (VITAMIN B-12) 1000 MCG SUBL Take by mouth.  Marland Kitchen LANTUS SOLOSTAR 100 UNIT/ML Solostar Pen 14 Units at bedtime.   . metFORMIN (GLUCOPHAGE) 500 MG tablet Take by mouth 2 (two) times daily with a meal.  . metoprolol succinate (TOPROL-XL) 100 MG 24 hr tablet Take 100 mg by mouth daily. Take with or immediately following a meal.  . Multiple Vitamin (MULTIVITAMIN) tablet Take 1 tablet by mouth daily.  Marland Kitchen NOVOLOG FLEXPEN 100  UNIT/ML FlexPen 4 Units 3 (three) times daily with meals.   Marland Kitchen omeprazole (PRILOSEC) 40 MG capsule Take 40 mg by mouth daily.  . rosuvastatin (CRESTOR) 10 MG tablet TAKE ONE TABLET BY MOUTH ONCE DAILY  . vitamin B-12 (CYANOCOBALAMIN) 250 MCG tablet Take 250 mcg by mouth daily.    Past Medical History:  Diagnosis Date  . Arthritis   . 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 Diabetes mellitus without complication ([place])   . GERD (gastroesophageal reflux disease)   . Hyperlipidemia   . Hypertension   . Peripheral neuropathy   . Peripheral vascular disease [facility])     Past Surgical History:  Procedure Laterality Date  . APPENDECTOMY    . BREAST BIOPSY Left    neg  . PERIPHERAL VASCULAR CATHETERIZATION Right 11/07/2015   Procedure: Lower Extremity Angiography;  Surgeon: Katha Cabal, MD;  Location: [place] [facility];  Service: Cardiovascular;  Laterality: Right;  . TONSILLECTOMY      Social History Social History   Tobacco Use  . Smoking status: Former Smoker    Packs/day: 1.00    Years: 39.00    Pack years: 39.00    Types: Cigarettes  . Smokeless tobacco: Never Used  Substance Use Topics  . Alcohol use: Yes    Alcohol/week: 2.0 standard drinks    Types: 2 Glasses of wine per week  . Drug use: No    Family History Family History  Problem Relation Age of Onset  . Breast cancer Sister 20    Allergies  Allergen Reactions  . Atorvastatin Other (See Comments)  . Colestipol Other (See Comments)    Heartburn     REVIEW OF SYSTEMS (Negative unless checked)  Constitutional: [] Weight loss  [] Fever  [] Chills Cardiac: [] Chest pain   [] Chest pressure   [] Palpitations   [] Shortness of breath when laying flat   [] Shortness of breath with exertion. Vascular:  [x] Pain in legs with walking   [] Pain in legs at rest  [] History of DVT   [] Phlebitis   [] Swelling in legs   [] Varicose veins   [] Non-healing ulcers Pulmonary:   [] Uses home oxygen   [] Productive  cough   [] Hemoptysis   [] Wheeze  [] COPD   [] Asthma Neurologic:  [] Dizziness   [] Seizures   [] History of stroke   [] History of TIA  [] Aphasia   [] Vissual changes   [] Weakness or numbness in arm   [] Weakness or numbness in leg Musculoskeletal:   [] Joint swelling   [] Joint pain   [] Low back pain Hematologic:  [] Easy bruising  [] Easy bleeding   [] Hypercoagulable state   [] Anemic Gastrointestinal:  [] Diarrhea   [] Vomiting  [] Gastroesophageal reflux/heartburn   [] Difficulty swallowing. Genitourinary:  [] Chronic kidney disease   [] Difficult urination  [] Frequent urination   [] Blood in urine Skin:  [] Rashes   [] Ulcers  Psychological:  [] History of anxiety   []  History of major depression.  Physical Examination  Vitals:   12/25/17 0933  BP: 120/64  Pulse: 88  Resp: 16  Weight: 143 lb 9.6 oz (65.1 kg)  Height: 5\' 5"  (1.651 m)   Body mass index is 23.9 kg/m. Gen: WD/WN, NAD Head: [place]/AT, No temporalis wasting.  Ear/Nose/Throat: Hearing grossly intact, nares w/o erythema or drainage Eyes: PER, EOMI, sclera nonicteric.  Neck: Supple, no large masses.   Pulmonary:  Good air movement, no audible wheezing bilaterally, no use of accessory muscles.  Cardiac: RRR, no JVD Vascular:  Vessel Right Left  Radial Palpable Palpable  PT Not Palpable Not Palpable  DP Not Palpable Not Palpable  Gastrointestinal: Non-distended. No guarding/no peritoneal signs.  Musculoskeletal: M/S 5/5 throughout.  No deformity or atrophy.  Neurologic: CN 2-12 intact. Symmetrical.  Speech is fluent. Motor exam as listed above. Psychiatric: Judgment intact, Mood & affect appropriate for pt's clinical situation. Dermatologic: No rashes or ulcers noted.  No changes consistent with cellulitis. Lymph : No lichenification or skin changes of chronic lymphedema.  CBC No results found for: WBC, HGB, HCT, MCV, PLT  BMET    Component Value Date/Time   NA 138 08/01/2015 1639   K 4.3 08/01/2015 1639   CL 104 08/01/2015 1639    CO2 26 08/01/2015 1639   GLUCOSE 159 (H) 08/01/2015 1639   BUN 20 11/06/2015 1133   CREATININE 0.97 11/06/2015 1133   CALCIUM 9.8 08/01/2015 1639   GFRNONAA 53 (L) 11/06/2015 1133   GFRAA >60 11/06/2015 1133   CrCl cannot be calculated (Patient's most recent lab result is older than the maximum 21 days allowed.).  COAG No results found for: INR, PROTIME  Radiology No results found.   Assessment/Plan 1. Atherosclerosis of native artery of both lower extremities with intermittent claudication (HCC) Recommend:  The patient has evidence of atherosclerosis of the lower extremities with claudication.  The patient does not voice lifestyle limiting changes at this point in time.  Noninvasive studies do not suggest clinically significant change.  No invasive studies, angiography or surgery at this time The  patient should continue walking and begin a more formal exercise program.  The patient should continue antiplatelet therapy and aggressive treatment of the lipid abnormalities  No changes in the patient's medications at this time  The patient should continue wearing graduated compression socks 10-15 mmHg strength to control the mild edema.   - VAS US AORTA/IVC/ILIACS; Future - VAS [place] ABI WITH/WO TBI; Future  2. Mixed hyperlipidemia Continue statin as ordered and reviewed, no changes at this time   3. Essential hypertension Continue antihypertensive medications as already ordered, these medications have been reviewed and there are no changes at this time.   4. Primary osteoarthritis of both knees Continue NSAID medications as already ordered, these medications have been reviewed and there are no changes at this time.  Continued activity and therapy was stressed.     Hortencia Pilar, MD  12/25/2017 12:58 PM

## 2018-01-13 IMAGING — US US EXTREM LOW VENOUS*L*
1 series · 13 of 24 positions shown · non-contrast
Comparison: 04/23/2011

CLINICAL DATA: Left lower extremity pain for 1 day. History of
squamous cell carcinoma on the left lower extremity.



[Series 1: us extrem low venous*left* · 0.08mm/px · 13 of 34 slices shown]
[im 1/34]
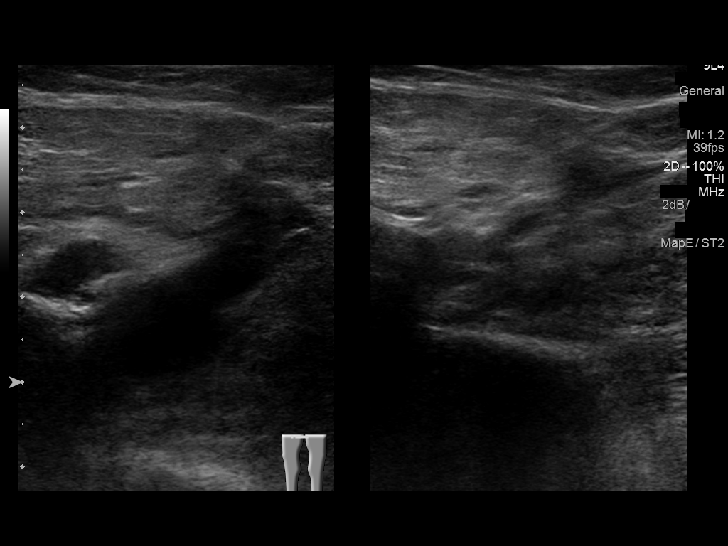
[im 3/34]
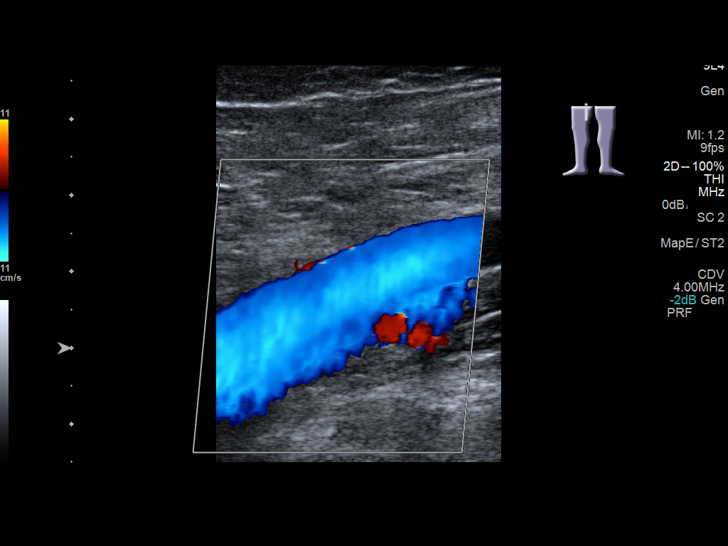
[im 6/34]
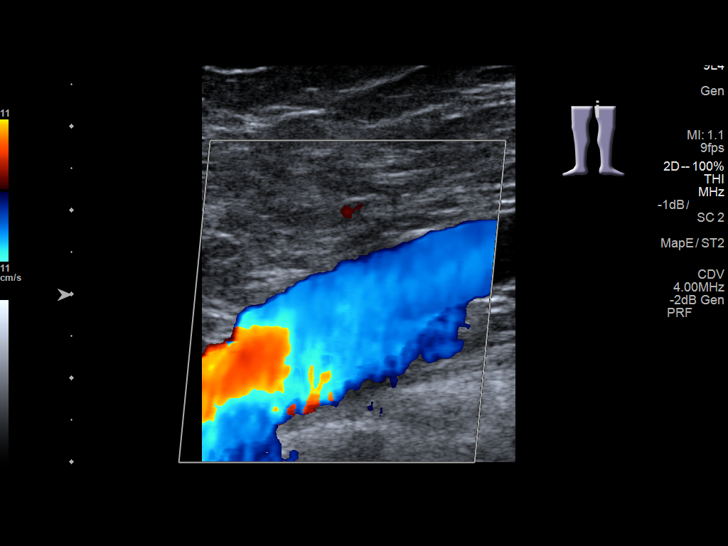
[im 9/34]
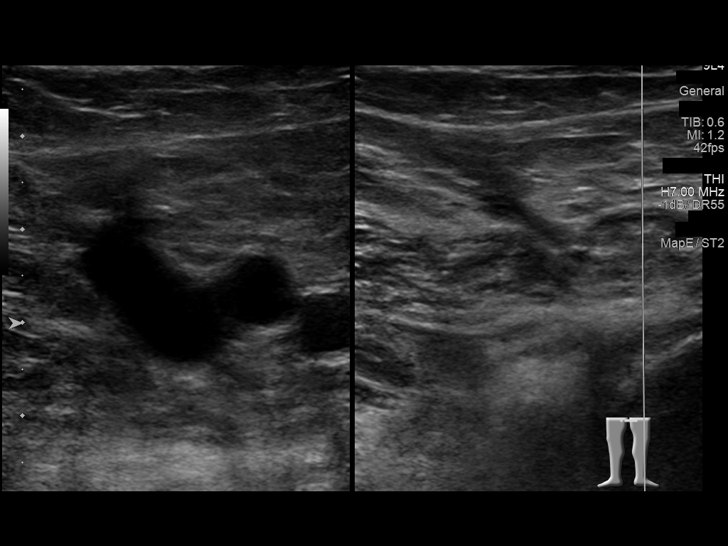
[im 12/34]
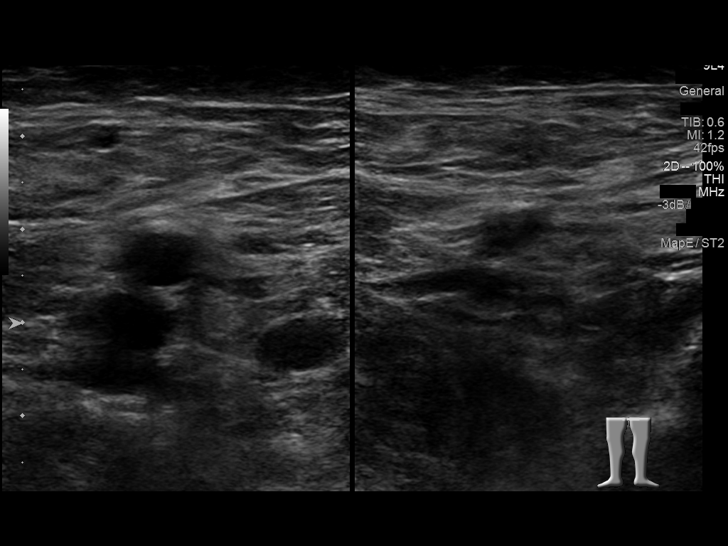
[im 15/34]
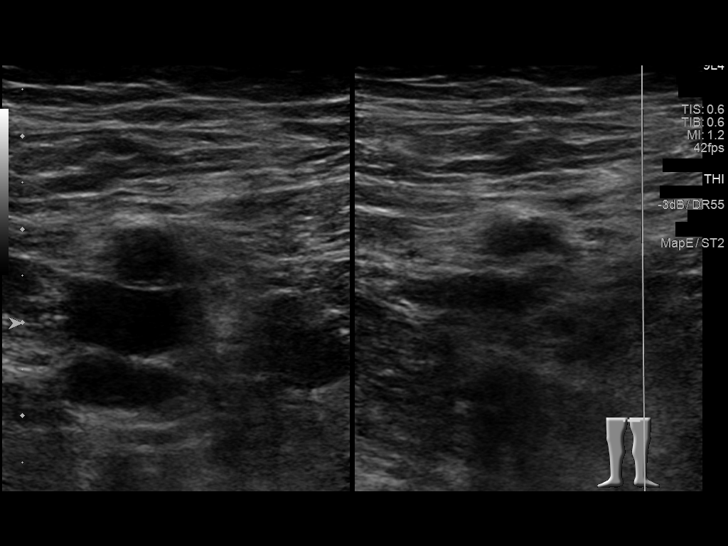
[im 18/34]
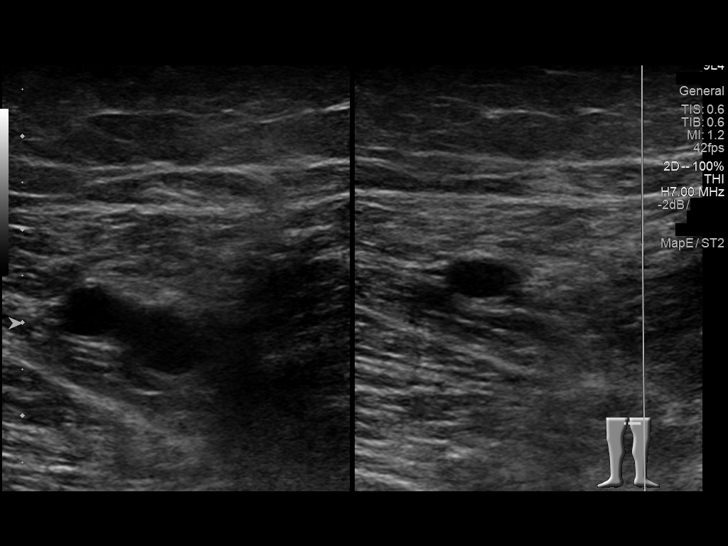
[im 19/34]
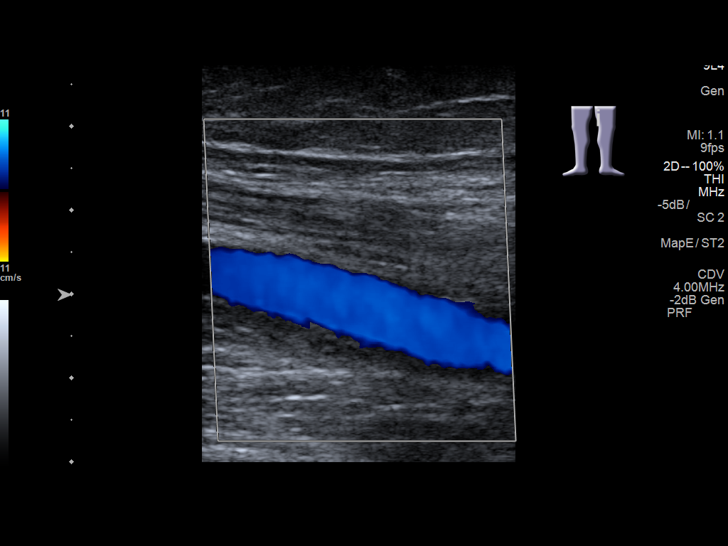
[im 22/34]
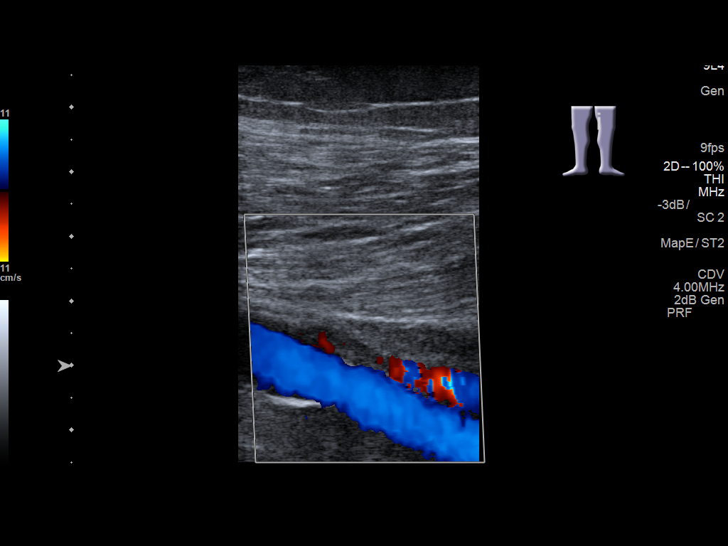
[im 25/34]
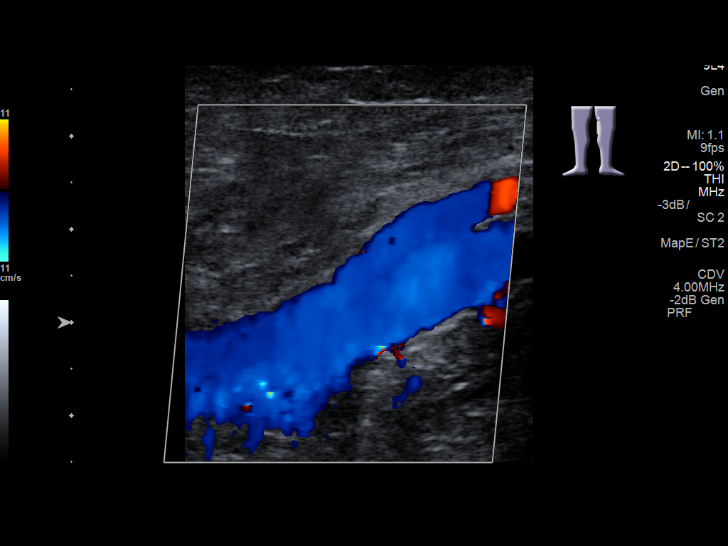
[im 28/34]
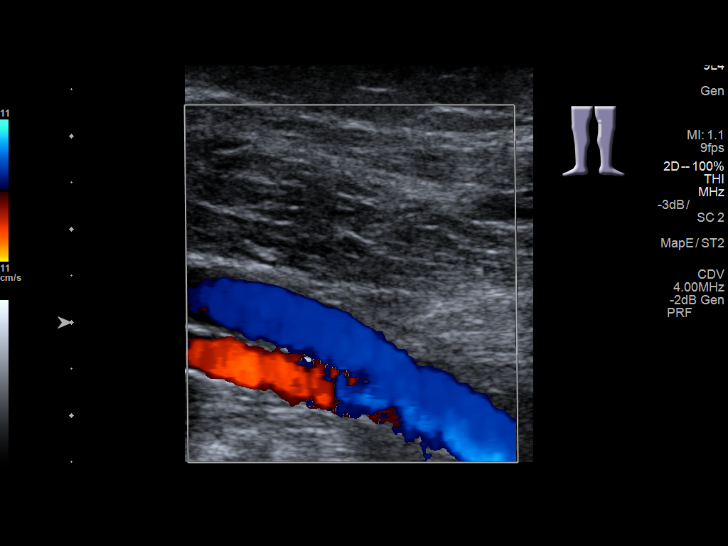
[im 31/34]
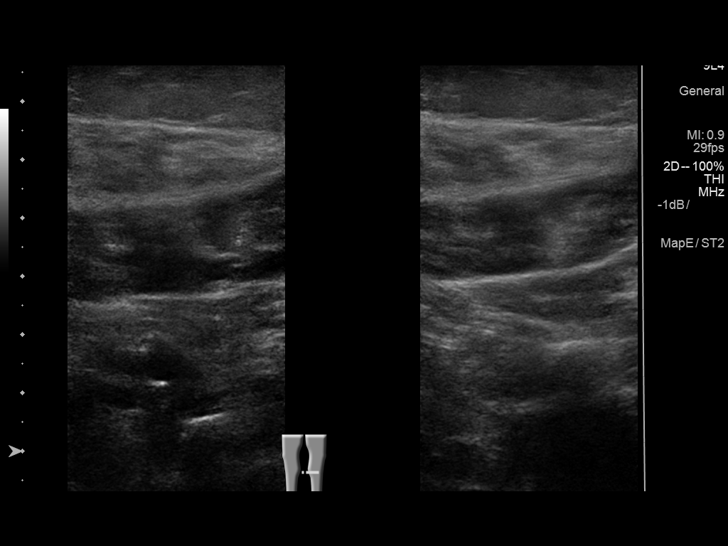
[im 34/34]
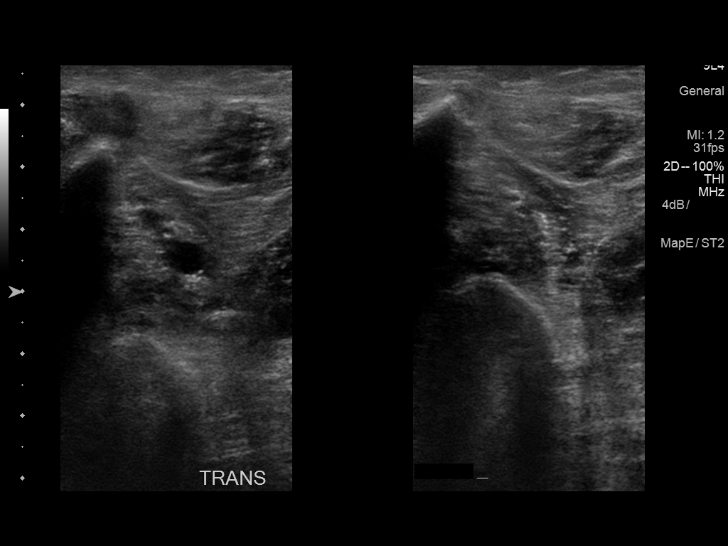

[13 of 24 positions shown; findings below may reference images not displayed]

FINDINGS: Contralateral Common Femoral Vein: Respiratory phasicity is normal
and symmetric with the symptomatic side. No evidence of thrombus.
Normal compressibility.

Common Femoral Vein: No evidence of thrombus. Normal
compressibility, respiratory phasicity and response to augmentation.

Saphenofemoral Junction: No evidence of thrombus. Normal
compressibility and flow on color Doppler imaging.

Profunda Femoral Vein: No evidence of thrombus. Normal
compressibility and flow on color Doppler imaging.

Femoral Vein: No evidence of thrombus. Normal compressibility,
respiratory phasicity and response to augmentation.

Popliteal Vein: No evidence of thrombus. Normal compressibility,
respiratory phasicity and response to augmentation.

Calf Veins: No evidence of thrombus. Normal compressibility and flow
on color Doppler imaging.

Superficial Great Saphenous Vein: No evidence of thrombus. Normal
compressibility and flow on color Doppler imaging.

Venous Reflux:  None.

Other Findings:  None.
IMPRESSION: No evidence of deep venous thrombosis.

## 2018-12-28 ENCOUNTER — Encounter (INDEPENDENT_AMBULATORY_CARE_PROVIDER_SITE_OTHER): Payer: Self-pay

## 2018-12-28 ENCOUNTER — Other Ambulatory Visit: Payer: Self-pay

## 2018-12-28 ENCOUNTER — Encounter (INDEPENDENT_AMBULATORY_CARE_PROVIDER_SITE_OTHER): Payer: Self-pay | Admitting: Vascular Surgery

## 2018-12-28 ENCOUNTER — Ambulatory Visit (INDEPENDENT_AMBULATORY_CARE_PROVIDER_SITE_OTHER): Payer: Medicare PPO | Admitting: Vascular Surgery

## 2018-12-28 ENCOUNTER — Ambulatory Visit (INDEPENDENT_AMBULATORY_CARE_PROVIDER_SITE_OTHER): Payer: Medicare PPO

## 2018-12-28 ENCOUNTER — Encounter (INDEPENDENT_AMBULATORY_CARE_PROVIDER_SITE_OTHER): Payer: Medicare PPO

## 2018-12-28 VITALS — BP 99/62 | HR 79 | Resp 15 | Wt 149.4 lb

## 2018-12-28 DIAGNOSIS — M170 Bilateral primary osteoarthritis of knee: Secondary | ICD-10-CM | POA: Diagnosis not present

## 2018-12-28 DIAGNOSIS — I70213 Atherosclerosis of native arteries of extremities with intermittent claudication, bilateral legs: Secondary | ICD-10-CM

## 2018-12-28 DIAGNOSIS — I10 Essential (primary) hypertension: Secondary | ICD-10-CM | POA: Diagnosis not present

## 2018-12-28 DIAGNOSIS — E782 Mixed hyperlipidemia: Secondary | ICD-10-CM

## 2018-12-28 NOTE — Progress Notes (Signed)
MRN : [id number]  Katie Cunningham is a 85 y.o. ([birth year]) female who presents with chief complaint of No chief complaint on file. Marland Kitchen  History of Present Illness:  The patient returns to the office for followup and review of the noninvasive studies.   She is s/p bilateral kissing stents in the common iliac arteries on 11/07/2015.  There have been no interval changes in lower extremity symptoms. No interval shortening of the patient's claudication distance or development of rest pain symptoms. No new ulcers or wounds have occurred since the last visit.  There have been no significant changes to the patient's overall health care.  The patient denies amaurosis fugax or recent TIA symptoms. There are no recent neurological changes noted. The patient denies history of DVT, PE or superficial thrombophlebitis. The patient denies recent episodes of angina or shortness of breath.   ABI Rt=[place] and Lt=1.15 triphasic tibial signal bilaterally(previous ABI's Rt=1.04and Lt=1.13)   No outpatient medications have been marked as taking for the 12/28/18 encounter (Appointment) with Delana Meyer, Dolores Lory, MD.    Past Medical History:  Diagnosis Date  . Arthritis   . 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 Diabetes mellitus without complication ([place])   . GERD (gastroesophageal reflux disease)   . Hyperlipidemia   . Hypertension   . Peripheral neuropathy   . Peripheral vascular disease [facility])     Past Surgical History:  Procedure Laterality Date  . APPENDECTOMY    . BREAST BIOPSY Left    neg  . PERIPHERAL VASCULAR CATHETERIZATION Right 11/07/2015   Procedure: Lower Extremity Angiography;  Surgeon: Katha Cabal, MD;  Location: [place] [facility];  Service: Cardiovascular;  Laterality: Right;  . TONSILLECTOMY      Social History Social History   Tobacco Use  . Smoking status: Former Smoker    Packs/day: 1.00    Years: 39.00    Pack years: 39.00    Types:  Cigarettes  . Smokeless tobacco: Never Used  Substance Use Topics  . Alcohol use: Yes    Alcohol/week: 2.0 standard drinks    Types: 2 Glasses of wine per week  . Drug use: No    Family History Family History  Problem Relation Age of Onset  . Breast cancer Sister 32    Allergies  Allergen Reactions  . Atorvastatin Other (See Comments)  . Colestipol Other (See Comments)    Heartburn     REVIEW OF SYSTEMS (Negative unless checked)  Constitutional: [] Weight loss  [] Fever  [] Chills Cardiac: [] Chest pain   [] Chest pressure   [] Palpitations   [] Shortness of breath when laying flat   [] Shortness of breath with exertion. Vascular:  [x] Pain in legs with walking   [x] Pain in legs at rest  [] History of DVT   [] Phlebitis   [] Swelling in legs   [] Varicose veins   [] Non-healing ulcers Pulmonary:   [] Uses home oxygen   [] Productive cough   [] Hemoptysis   [] Wheeze  [] COPD   [] Asthma Neurologic:  [] Dizziness   [] Seizures   [] History of stroke   [] History of TIA  [] Aphasia   [] Vissual changes   [] Weakness or numbness in arm   [] Weakness or numbness in leg Musculoskeletal:   [] Joint swelling   [] Joint pain   [] Low back pain Hematologic:  [] Easy bruising  [] Easy bleeding   [] Hypercoagulable state   [] Anemic Gastrointestinal:  [] Diarrhea   [] Vomiting  [] Gastroesophageal reflux/heartburn   [] Difficulty swallowing. Genitourinary:  [] Chronic  kidney disease   [] Difficult urination  [] Frequent urination   [] Blood in urine Skin:  [] Rashes   [] Ulcers  Psychological:  [] History of anxiety   []  History of major depression.  Physical Examination  There were no vitals filed for this visit. There is no height or weight on file to calculate BMI. Gen: WD/WN, NAD Head: [place]/AT, No temporalis wasting.  Ear/Nose/Throat: Hearing grossly intact, nares w/o erythema or drainage Eyes: PER, EOMI, sclera nonicteric.  Neck: Supple, no large masses.   Pulmonary:  Good air movement, no audible wheezing bilaterally, no  use of accessory muscles.  Cardiac: RRR, no JVD Vascular:  Vessel Right Left  PT Not Palpable Not Palpable  DP Palpable Palpable  Gastrointestinal: Non-distended. No guarding/no peritoneal signs.  Musculoskeletal: M/S 5/5 throughout.  No deformity or atrophy.  Neurologic: CN 2-12 intact. Symmetrical.  Speech is fluent. Motor exam as listed above. Psychiatric: Judgment intact, Mood & affect appropriate for pt's clinical situation. Dermatologic: No rashes or ulcers noted.  No changes consistent with cellulitis. Lymph : No lichenification or skin changes of chronic lymphedema.  CBC No results found for: WBC, HGB, HCT, MCV, PLT  BMET    Component Value Date/Time   NA 138 08/01/2015 1639   K 4.3 08/01/2015 1639   CL 104 08/01/2015 1639   CO2 26 08/01/2015 1639   GLUCOSE 159 (H) 08/01/2015 1639   BUN 20 11/06/2015 1133   CREATININE 0.97 11/06/2015 1133   CALCIUM 9.8 08/01/2015 1639   GFRNONAA 53 (L) 11/06/2015 1133   GFRAA >60 11/06/2015 1133   CrCl cannot be calculated (Patient's most recent lab result is older than the maximum 21 days allowed.).  COAG No results found for: INR, PROTIME  Radiology No results found.   Assessment/Plan 1. Atherosclerosis of native artery of both lower extremities with intermittent claudication (HCC) Recommend:  The patient has evidence of atherosclerosis of the lower extremities with claudication. The patient does not voice lifestyle limiting changes at this point in time.  Noninvasive studies do not suggest clinically significant change.  No invasive studies, angiography or surgery at this time The patient should continue walking and begin a more formal exercise program.  The patient should continue antiplatelet therapy and aggressive treatment of the lipid abnormalities  No changes in the patient's medications at this time  The patient should continue wearing graduated compression socks 10-15 mmHg strength to control the mild  edema.  - VAS [place] ABI WITH/WO TBI; Future  2. Essential hypertension Continue antihypertensive medications as already ordered, these medications have been reviewed and there are no changes at this time.   3. Mixed hyperlipidemia Continue statin as ordered and reviewed, no changes at this time   4. Primary osteoarthritis of both knees Continue NSAID medications as already ordered, these medications have been reviewed and there are no changes at this time.  Continued activity and therapy was stressed.    Hortencia Pilar, MD  12/28/2018 11:06 AM

## 2019-11-10 DIAGNOSIS — I272 Pulmonary hypertension, unspecified: Secondary | ICD-10-CM | POA: Insufficient documentation

## 2019-12-30 ENCOUNTER — Ambulatory Visit (INDEPENDENT_AMBULATORY_CARE_PROVIDER_SITE_OTHER): Payer: Medicare PPO

## 2019-12-30 ENCOUNTER — Other Ambulatory Visit: Payer: Self-pay

## 2019-12-30 ENCOUNTER — Ambulatory Visit (INDEPENDENT_AMBULATORY_CARE_PROVIDER_SITE_OTHER): Payer: Medicare PPO | Admitting: Nurse Practitioner

## 2019-12-30 ENCOUNTER — Encounter (INDEPENDENT_AMBULATORY_CARE_PROVIDER_SITE_OTHER): Payer: Self-pay | Admitting: Nurse Practitioner

## 2019-12-30 VITALS — BP 119/67 | HR 89 | Resp 16 | Wt 146.6 lb

## 2019-12-30 DIAGNOSIS — I70213 Atherosclerosis of native arteries of extremities with intermittent claudication, bilateral legs: Secondary | ICD-10-CM

## 2019-12-30 DIAGNOSIS — I10 Essential (primary) hypertension: Secondary | ICD-10-CM

## 2020-01-03 ENCOUNTER — Encounter (INDEPENDENT_AMBULATORY_CARE_PROVIDER_SITE_OTHER): Payer: Self-pay | Admitting: Nurse Practitioner

## 2020-01-03 NOTE — Progress Notes (Signed)
Subjective:    Patient ID: Katie Cunningham, female    DOB: 1931/10/07, 86 y.o.   MRN: [id number] Chief Complaint  Patient presents with  . Follow-up    ultrasound follow up    The patient returns to the office for followup and review of the noninvasive studies. There have been no interval changes in lower extremity symptoms. No interval shortening of the patient's claudication distance or development of rest pain symptoms. No new ulcers or wounds have occurred since the last visit.  There have been no significant changes to the patient's overall health care.  The patient denies amaurosis fugax or recent TIA symptoms. There are no recent neurological changes noted. The patient denies history of DVT, PE or superficial thrombophlebitis. The patient denies recent episodes of angina or shortness of breath.   ABI Rt=[place] and Lt=1.06  (previous ABI's Rt= and Lt=1.50) Duplex ultrasound of the right lower extremity reveals triphasic tibial artery waveforms with good toe waveforms.  The left lower extremity has triphasic/biphasic waveforms with good toe waveforms   Review of Systems  Neurological: Positive for numbness.  All other systems reviewed and are negative.      Objective:   Physical Exam Vitals reviewed.  HENT:     Head: Normocephalic.  Cardiovascular:     Rate and Rhythm: Normal rate and regular rhythm.     Pulses: Decreased pulses.  Pulmonary:     Effort: Pulmonary effort is normal.  Neurological:     Mental Status: She is alert and oriented to person, place, and time.  Psychiatric:        Mood and Affect: Mood normal.        Behavior: Behavior normal.        Thought Content: Thought content normal.        Judgment: Judgment normal.     BP 119/67 (BP Location: Right Arm)   Pulse 89   Resp 16   Wt 146 lb 9.6 oz (66.5 kg)   BMI 24.40 kg/m   Past Medical History:  Diagnosis Date  . Arthritis   . 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  Diabetes mellitus without complication ([place])   . GERD (gastroesophageal reflux disease)   . Hyperlipidemia   . Hypertension   . Peripheral neuropathy   . Peripheral vascular disease ([place])     Social History   Socioeconomic History  . Marital status: Widowed    Spouse name: Not on file  . Number of children: Not on file  . Years of education: Not on file  . Highest education level: Not on file  Occupational History  . Not on file  Tobacco Use  . Smoking status: Former Smoker    Packs/day: 1.00    Years: 39.00    Pack years: 39.00    Types: Cigarettes  . Smokeless tobacco: Never Used  Substance and Sexual Activity  . Alcohol use: Yes    Alcohol/week: 2.0 standard drinks    Types: 2 Glasses of wine per week  . Drug use: No  . Sexual activity: Not on file  Other Topics Concern  . Not on file  Social History Narrative  . Not on file   Social Determinants of Health   Financial Resource Strain:   . Difficulty of Paying Living Expenses: Not on file  Food Insecurity:   . Worried About Charity fundraiser in the Last Year: Not on file  . Ran Out  of Food in the Last Year: Not on file  Transportation Needs:   . Lack of Transportation (Medical): Not on file  . Lack of Transportation (Non-Medical): Not on file  Physical Activity:   . Days of Exercise per Week: Not on file  . Minutes of Exercise per Session: Not on file  Stress:   . Feeling of Stress : Not on file  Social Connections:   . Frequency of Communication with Friends and Family: Not on file  . Frequency of Social Gatherings with Friends and Family: Not on file  . Attends Religious Services: Not on file  . Active Member of Clubs or Organizations: Not on file  . Attends Archivist Meetings: Not on file  . Marital Status: Not on file  Intimate Partner Violence:   . Fear of Current or Ex-Partner: Not on file  . Emotionally Abused: Not on file  . Physically Abused: Not on file  . Sexually Abused: Not on  file    Past Surgical History:  Procedure Laterality Date  . APPENDECTOMY    . BREAST BIOPSY Left    neg  . PERIPHERAL VASCULAR CATHETERIZATION Right 11/07/2015   Procedure: Lower Extremity Angiography;  Surgeon: Katha Cabal, MD;  Location: [facility];  Service: Cardiovascular;  Laterality: Right;  . TONSILLECTOMY      Family History  Problem Relation Age of Onset  . Breast cancer Sister 46    Allergies  Allergen Reactions  . Atorvastatin Other (See Comments)  . Colestipol Other (See Comments)    Heartburn       Assessment & Plan:   1. 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  The patient does not voice lifestyle limiting changes at this point in time.  Noninvasive studies do not suggest clinically significant change.  No invasive studies, angiography or surgery at this time The patient should continue walking and begin a more formal exercise program.  The patient should continue antiplatelet therapy and aggressive treatment of the lipid abnormalities  No changes in the patient's medications at this time  The patient should continue wearing graduated compression socks 10-15 mmHg strength to control the mild edema.   Patient return in 1 year or sooner if issues should arise.  2. Essential hypertension Continue antihypertensive medications as already ordered, these medications have been reviewed and there are no changes at this time.    Current Outpatient Medications on File Prior to Visit  Medication Sig Dispense Refill  . acetaminophen (TYLENOL) 325 MG tablet Take 650 mg by mouth every 6 (six) hours as needed.    Marland Kitchen amLODipine (NORVASC) 10 MG tablet Take 10 mg by mouth daily.    Marland Kitchen aspirin 81 MG chewable tablet Chew by mouth daily.    . cholecalciferol (VITAMIN D) 400 units TABS tablet Take 1,000  Units by mouth 2 (two) times daily.     Marland Kitchen co-enzyme Q-10 30 MG capsule Take 30 mg by mouth 3 (three) times daily.    . COLLAGEN PO Take 1 capsule by mouth daily.    Marland Kitchen LANTUS SOLOSTAR 100 UNIT/ML Solostar Pen 14 Units at bedtime.     . metFORMIN (GLUCOPHAGE) 500 MG tablet Take by mouth 2 (two) times daily with a meal.    . metoprolol succinate (TOPROL-XL) 100 MG 24 hr tablet Take 100 mg by mouth daily. Take with or immediately  following a meal.    . Multiple Vitamin (MULTIVITAMIN) tablet Take 1 tablet by mouth daily.    Marland Kitchen NOVOLOG FLEXPEN 100 UNIT/ML FlexPen 4 Units 3 (three) times daily with meals.     Marland Kitchen omeprazole (PRILOSEC) 40 MG capsule Take 40 mg by mouth daily.    . rosuvastatin (CRESTOR) 10 MG tablet TAKE ONE TABLET BY MOUTH ONCE DAILY    . clopidogrel (PLAVIX) 75 MG tablet Take 1 tablet (75 mg total) by mouth daily. (Patient not taking: Reported on 10/14/2016) 30 tablet 5  . Cyanocobalamin (VITAMIN B-12) 1000 MCG SUBL Take by mouth. (Patient not taking: Reported on 12/30/2019)    . gabapentin (NEURONTIN) 300 MG capsule Take 300 mg by mouth 4 (four) times daily. (Patient not taking: Reported on 12/30/2019)    . pravastatin (PRAVACHOL) 20 MG tablet Take 20 mg by mouth daily. (Patient not taking: Reported on 12/30/2019)    . vitamin B-12 (CYANOCOBALAMIN) 250 MCG tablet Take 250 mcg by mouth daily. (Patient not taking: Reported on 12/30/2019)    . Vitamin D, Ergocalciferol, (DRISDOL) 50000 units CAPS capsule Take by mouth. (Patient not taking: Reported on 12/30/2019)     No current facility-administered medications on file prior to visit.    There are no Patient Instructions on file for this visit. No follow-ups on file.   Kris Hartmann, NP

## 2020-12-28 ENCOUNTER — Ambulatory Visit (INDEPENDENT_AMBULATORY_CARE_PROVIDER_SITE_OTHER): Payer: Medicare PPO | Admitting: Vascular Surgery

## 2020-12-28 ENCOUNTER — Encounter (INDEPENDENT_AMBULATORY_CARE_PROVIDER_SITE_OTHER): Payer: Medicare PPO

## 2021-01-22 ENCOUNTER — Ambulatory Visit (INDEPENDENT_AMBULATORY_CARE_PROVIDER_SITE_OTHER): Payer: Medicare PPO | Admitting: Vascular Surgery

## 2021-01-22 ENCOUNTER — Encounter (INDEPENDENT_AMBULATORY_CARE_PROVIDER_SITE_OTHER): Payer: Self-pay | Admitting: Vascular Surgery

## 2021-01-22 ENCOUNTER — Other Ambulatory Visit (INDEPENDENT_AMBULATORY_CARE_PROVIDER_SITE_OTHER): Payer: Self-pay | Admitting: Vascular Surgery

## 2021-01-22 ENCOUNTER — Other Ambulatory Visit: Payer: Self-pay

## 2021-01-22 ENCOUNTER — Ambulatory Visit (INDEPENDENT_AMBULATORY_CARE_PROVIDER_SITE_OTHER): Payer: Medicare PPO

## 2021-01-22 VITALS — BP 77/47 | HR 86 | Ht 65.0 in | Wt 140.0 lb

## 2021-01-22 DIAGNOSIS — I70213 Atherosclerosis of native arteries of extremities with intermittent claudication, bilateral legs: Secondary | ICD-10-CM | POA: Diagnosis not present

## 2021-01-22 DIAGNOSIS — I739 Peripheral vascular disease, unspecified: Secondary | ICD-10-CM

## 2021-01-22 DIAGNOSIS — E1169 Type 2 diabetes mellitus with other specified complication: Secondary | ICD-10-CM | POA: Diagnosis not present

## 2021-01-22 DIAGNOSIS — I6523 Occlusion and stenosis of bilateral carotid arteries: Secondary | ICD-10-CM | POA: Diagnosis not present

## 2021-01-22 DIAGNOSIS — I10 Essential (primary) hypertension: Secondary | ICD-10-CM

## 2021-01-22 DIAGNOSIS — Z9889 Other specified postprocedural states: Secondary | ICD-10-CM

## 2021-01-22 DIAGNOSIS — E785 Hyperlipidemia, unspecified: Secondary | ICD-10-CM

## 2021-01-22 NOTE — Progress Notes (Signed)
MRN : [id number]  Katie Cunningham is a 87 y.o. (1931/04/27) female who presents with chief complaint of check legs.  History of Present Illness:     The patient returns to the office for followup and review of the noninvasive studies.    She is s/p bilateral kissing stents in the common iliac arteries on 11/07/2015.   There have been no interval changes in lower extremity symptoms. No interval shortening of the patient's claudication distance or development of rest pain symptoms. No new ulcers or wounds have occurred since the last visit.   There have been no significant changes to the patient's overall health care.   The patient denies amaurosis fugax or recent TIA symptoms. There are no recent neurological changes noted. The patient denies history of DVT, PE or superficial thrombophlebitis. The patient denies recent episodes of angina or shortness of breath.    ABI Rt=[place] and Lt=1.15 triphasic tibial signal bilaterally (previous ABI's Rt=1.04 and Lt=1.13)  Current Meds  Medication Sig   acetaminophen (TYLENOL) 325 MG tablet Take 650 mg by mouth every 6 (six) hours as needed.   amLODipine (NORVASC) 10 MG tablet Take 10 mg by mouth daily.   aspirin 81 MG chewable tablet Chew by mouth daily.   cholecalciferol (VITAMIN D) 400 units TABS tablet Take 1,000 Units by mouth 2 (two) times daily.    clopidogrel (PLAVIX) 75 MG tablet Take 1 tablet (75 mg total) by mouth daily.   co-enzyme Q-10 30 MG capsule Take 30 mg by mouth 3 (three) times daily.   COLLAGEN PO Take 1 capsule by mouth daily.   Cyanocobalamin (VITAMIN B-12) 1000 MCG SUBL Take by mouth.   gabapentin (NEURONTIN) 300 MG capsule Take 300 mg by mouth 4 (four) times daily.   LANTUS SOLOSTAR 100 UNIT/ML Solostar Pen 14 Units at bedtime.    metFORMIN (GLUCOPHAGE) 500 MG tablet Take by mouth 2 (two) times daily with a meal.   metoprolol succinate (TOPROL-XL) 100 MG 24 hr tablet Take 100 mg by mouth daily. Take with or  immediately following a meal.   Multiple Vitamin (MULTIVITAMIN) tablet Take 1 tablet by mouth daily.   NOVOLOG FLEXPEN 100 UNIT/ML FlexPen 4 Units 3 (three) times daily with meals.    omeprazole (PRILOSEC) 40 MG capsule Take 40 mg by mouth daily.   pravastatin (PRAVACHOL) 20 MG tablet Take 20 mg by mouth daily.   rosuvastatin (CRESTOR) 10 MG tablet TAKE ONE TABLET BY MOUTH ONCE DAILY   vitamin B-12 (CYANOCOBALAMIN) 250 MCG tablet Take 250 mcg by mouth daily.   Vitamin D, Ergocalciferol, (DRISDOL) 50000 units CAPS capsule Take by mouth.    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, neck, and leg   Diabetes mellitus without complication (HCC)    GERD (gastroesophageal reflux disease)    Hyperlipidemia    Hypertension    Peripheral neuropathy    Peripheral vascular disease ([place])     Past Surgical History:  Procedure Laterality Date   APPENDECTOMY     BREAST BIOPSY Left    neg   PERIPHERAL VASCULAR CATHETERIZATION Right 11/07/2015   Procedure: Lower Extremity Angiography;  Surgeon: Katha Cabal, MD;  Location: [facility];  Service: Cardiovascular;  Laterality: Right;   TONSILLECTOMY      Social History Social History   Tobacco Use   Smoking status: Former    Packs/day: 1.00    Years:  39.00    Pack years: 39.00    Types: Cigarettes   Smokeless tobacco: Never  Substance Use Topics   Alcohol use: Yes    Alcohol/week: 2.0 standard drinks    Types: 2 Glasses of wine per week   Drug use: No    Family History Family History  Problem Relation Age of Onset   Breast cancer Sister 66    Allergies  Allergen Reactions   Atorvastatin Other (See Comments)   Colestipol Other (See Comments)    Heartburn     REVIEW OF SYSTEMS (Negative unless checked)  Constitutional: [] Weight loss  [] Fever  [] Chills Cardiac: [] Chest pain   [] Chest pressure   [] Palpitations   [] Shortness of breath when laying flat    [] Shortness of breath with exertion. Vascular:  [x] Pain in legs with walking   [] Pain in legs at rest  [] History of DVT   [] Phlebitis   [] Swelling in legs   [] Varicose veins   [] Non-healing ulcers Pulmonary:   [] Uses home oxygen   [] Productive cough   [] Hemoptysis   [] Wheeze  [] COPD   [] Asthma Neurologic:  [] Dizziness   [] Seizures   [] History of stroke   [] History of TIA  [] Aphasia   [] Vissual changes   [] Weakness or numbness in arm   [] Weakness or numbness in leg Musculoskeletal:   [] Joint swelling   [x] Joint pain   [x] Low back pain Hematologic:  [] Easy bruising  [] Easy bleeding   [] Hypercoagulable state   [] Anemic Gastrointestinal:  [] Diarrhea   [] Vomiting  [x] Gastroesophageal reflux/heartburn   [] Difficulty swallowing. Genitourinary:  [] Chronic kidney disease   [] Difficult urination  [] Frequent urination   [] Blood in urine Skin:  [] Rashes   [] Ulcers  Psychological:  [] History of anxiety   []  History of major depression.  Physical Examination  Vitals:   01/22/21 1358  BP: (!) 77/47  Pulse: 86  Weight: 140 lb (63.5 kg)  Height: 5\' 5"  (1.651 m)   Body mass index is 23.3 kg/m. Gen: WD/WN, NAD Head: [place]/AT, No temporalis wasting.  Ear/Nose/Throat: Hearing grossly intact, nares w/o erythema or drainage Eyes: PER, EOMI, sclera nonicteric.  Neck: Supple, no masses.  No bruit or JVD.  Pulmonary:  Good air movement, no audible wheezing, no use of accessory muscles.  Cardiac: RRR, normal S1, S2, no Murmurs. Vascular:   left carotid bruit Vessel Right Left  Radial Palpable Palpable  Carotid Palpable Palpable  PT Not Palpable Not Palpable  DP Not Palpable Not Palpable  Gastrointestinal: soft, non-distended. No guarding/no peritoneal signs.  Musculoskeletal: M/S 5/5 throughout.  No visible deformity.  Neurologic: CN 2-12 intact. Pain and light touch intact in extremities.  Symmetrical.  Speech is fluent. Motor exam as listed above. Psychiatric: Judgment intact, Mood & affect appropriate for  pt's clinical situation. Dermatologic: No rashes or ulcers noted.  No changes consistent with cellulitis.   CBC No results found for: WBC, HGB, HCT, MCV, PLT  BMET    Component Value Date/Time   NA 138 08/01/2015 1639   K 4.3 08/01/2015 1639   CL 104 08/01/2015 1639   CO2 26 08/01/2015 1639   GLUCOSE 159 (H) 08/01/2015 1639   BUN 20 11/06/2015 1133   CREATININE 0.97 11/06/2015 1133   CALCIUM 9.8 08/01/2015 1639   GFRNONAA 53 (L) 11/06/2015 1133   GFRAA >60 11/06/2015 1133   CrCl cannot be calculated (Patient's most recent lab result is older than the maximum 21 days allowed.).  COAG No results found for: INR, PROTIME  Radiology No results found.  Assessment/Plan 1. Atherosclerosis of native artery of both lower extremities with intermittent claudication (HCC) Recommend:   The patient has evidence of atherosclerosis of the lower extremities with claudication.  The patient does not voice lifestyle limiting changes at this point in time.   Noninvasive studies do not suggest clinically significant change.   No invasive studies, angiography or surgery at this time The patient should continue walking and begin a more formal exercise program.  The patient should continue antiplatelet therapy and aggressive treatment of the lipid abnormalities   No changes in the patient's medications at this time   The patient should continue wearing graduated compression socks 10-15 mmHg strength to control the mild edema.  - VAS [place] ABI WITH/WO TBI; Future  2. Atherosclerosis of both carotid arteries Recommend:  Given the patient's asymptomatic subcritical stenosis no further invasive testing or surgery at this time.  Continue antiplatelet therapy as prescribed Continue management of CAD, HTN and Hyperlipidemia Healthy heart diet,  encouraged exercise at least 4 times per week Follow up in 12 months with duplex ultrasound and physical exam.    - VAS US CAROTID; Future  3.  Essential hypertension Continue antihypertensive medications as already ordered, these medications have been reviewed and there are no changes at this time.   4. Hyperlipidemia due to type 2 diabetes mellitus ([place]) Continue statin as ordered and reviewed, no changes at this time   5. Type 2 diabetes mellitus with other specified complication, unspecified whether long term insulin use (HCC) Continue hypoglycemic medications as already ordered, these medications have been reviewed and there are no changes at this time.  Hgb A1C to be monitored as already arranged by primary service     Hortencia Pilar, MD  01/22/2021 2:02 PM

## 2021-08-27 ENCOUNTER — Ambulatory Visit: Payer: Self-pay | Admitting: Family Medicine

## 2022-02-14 ENCOUNTER — Other Ambulatory Visit (INDEPENDENT_AMBULATORY_CARE_PROVIDER_SITE_OTHER): Payer: Self-pay | Admitting: Vascular Surgery

## 2022-02-14 DIAGNOSIS — I70213 Atherosclerosis of native arteries of extremities with intermittent claudication, bilateral legs: Secondary | ICD-10-CM

## 2022-02-14 DIAGNOSIS — I6523 Occlusion and stenosis of bilateral carotid arteries: Secondary | ICD-10-CM

## 2022-02-17 NOTE — Progress Notes (Signed)
MRN : [id number]  Katie Cunningham is a 88 y.o. (31-May-1931) female who presents with chief complaint of check circulation.  History of Present Illness:   The patient returns to the office for followup and review of the noninvasive studies.    She is s/p bilateral kissing stents in the common iliac arteries on 11/07/2015.   There have been no interval changes in lower extremity symptoms. No interval shortening of the patient's claudication distance or development of rest pain symptoms. No new ulcers or wounds have occurred since the last visit.   There have been no significant changes to the patient's overall health care.  The patient denies amaurosis fugax or recent TIA symptoms. There are no recent neurological changes noted. The patient denies history of DVT, PE or superficial thrombophlebitis. The patient denies recent episodes of angina or shortness of breath.    ABI Rt=[place] and Lt=[place] triphasic anterior tibial signal bilaterally (previous ABI's Rt=[place] and Lt=1.06 Triphasic signals)  Carotid duplex shows RICA 8-65% and LICA 78-46%  No outpatient medications have been marked as taking for the 02/18/22 encounter (Appointment) with Delana Meyer, Dolores Lory, MD.    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, neck, and leg   Diabetes mellitus without complication (HCC)    GERD (gastroesophageal reflux disease)    Hyperlipidemia    Hypertension    Peripheral neuropathy    Peripheral vascular disease ([place])     Past Surgical History:  Procedure Laterality Date   APPENDECTOMY     BREAST BIOPSY Left    neg   PERIPHERAL VASCULAR CATHETERIZATION Right 11/07/2015   Procedure: Lower Extremity Angiography;  Surgeon: Katha Cabal, MD;  Location: [facility];  Service: Cardiovascular;  Laterality: Right;   TONSILLECTOMY      Social History Social History   Tobacco Use   Smoking status: Former     Packs/day: 1.00    Years: 39.00    Total pack years: 39.00    Types: Cigarettes   Smokeless tobacco: Never  Substance Use Topics   Alcohol use: Yes    Alcohol/week: 2.0 standard drinks of alcohol    Types: 2 Glasses of wine per week   Drug use: No    Family History Family History  Problem Relation Age of Onset   Breast cancer Sister 81    Allergies  Allergen Reactions   Atorvastatin Other (See Comments)   Colestipol Other (See Comments)    Heartburn     REVIEW OF SYSTEMS (Negative unless checked)  Constitutional: '[]'$ Weight loss  '[]'$ Fever  '[]'$ Chills Cardiac: '[]'$ Chest pain   '[]'$ Chest pressure   '[]'$ Palpitations   '[]'$ Shortness of breath when laying flat   '[]'$ Shortness of breath with exertion. Vascular:  '[x]'$ Pain in legs with walking   '[]'$ Pain in legs at rest  '[]'$ History of DVT   '[]'$ Phlebitis   '[]'$ Swelling in legs   '[]'$ Varicose veins   '[]'$ Non-healing ulcers Pulmonary:   '[]'$ Uses home oxygen   '[]'$ Productive cough   '[]'$ Hemoptysis   '[]'$ Wheeze  '[]'$ COPD   '[]'$ Asthma Neurologic:  '[]'$ Dizziness   '[]'$ Seizures   '[]'$ History of stroke   '[]'$ History of TIA  '[]'$ Aphasia   '[]'$ Vissual changes   '[]'$ Weakness or numbness in arm   '[]'$ Weakness or numbness in leg Musculoskeletal:   '[]'$ Joint swelling   '[]'$ Joint pain   '[]'$ Low back pain Hematologic:  '[]'$   Easy bruising  '[]'$ Easy bleeding   '[]'$ Hypercoagulable state   '[]'$ Anemic Gastrointestinal:  '[]'$ Diarrhea   '[]'$ Vomiting  '[]'$ Gastroesophageal reflux/heartburn   '[]'$ Difficulty swallowing. Genitourinary:  '[]'$ Chronic kidney disease   '[]'$ Difficult urination  '[]'$ Frequent urination   '[]'$ Blood in urine Skin:  '[]'$ Rashes   '[]'$ Ulcers  Psychological:  '[]'$ History of anxiety   '[]'$  History of major depression.  Physical Examination  There were no vitals filed for this visit. There is no height or weight on file to calculate BMI. Gen: WD/WN, NAD Head: [place]/AT, No temporalis wasting.  Ear/Nose/Throat: Hearing grossly intact, nares w/o erythema or drainage Eyes: PER, EOMI, sclera nonicteric.  Neck: Supple, no masses.  No  bruit or JVD.  Pulmonary:  Good air movement, no audible wheezing, no use of accessory muscles.  Cardiac: RRR, normal S1, S2, no Murmurs. Vascular:  mild trophic changes, no open wounds Vessel Right Left  Radial Palpable Palpable  PT Not Palpable Not Palpable  DP Trace  Palpable Trace  Palpable  Gastrointestinal: soft, non-distended. No guarding/no peritoneal signs.  Musculoskeletal: M/S 5/5 throughout.  No visible deformity.  Neurologic: CN 2-12 intact. Pain and light touch intact in extremities.  Symmetrical.  Speech is fluent. Motor exam as listed above. Psychiatric: Judgment intact, Mood & affect appropriate for pt's clinical situation. Dermatologic: No rashes or ulcers noted.  No changes consistent with cellulitis.   CBC No results found for: "WBC", "HGB", "HCT", "MCV", "PLT"  BMET    Component Value Date/Time   NA 138 08/01/2015 1639   K 4.3 08/01/2015 1639   CL 104 08/01/2015 1639   CO2 26 08/01/2015 1639   GLUCOSE 159 (H) 08/01/2015 1639   BUN 20 11/06/2015 1133   CREATININE 0.97 11/06/2015 1133   CALCIUM 9.8 08/01/2015 1639   GFRNONAA 53 (L) 11/06/2015 1133   GFRAA >60 11/06/2015 1133   CrCl cannot be calculated (Patient's most recent lab result is older than the maximum 21 days allowed.).  COAG No results found for: "INR", "PROTIME"  Radiology No results found.   Assessment/Plan 1. 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  The patient does not voice lifestyle limiting changes at this point in time.  Noninvasive studies do not suggest clinically significant change.  No invasive studies, angiography or surgery at this time The patient should continue walking and begin a more formal exercise program.  The patient should continue antiplatelet therapy and aggressive treatment of the lipid abnormalities  No  changes in the patient's medications at this time  Continued surveillance is indicated as atherosclerosis is likely to progress with time.    The patient will continue follow up with noninvasive studies as ordered.  - VAS [place] ABI WITH/WO TBI; Future  2. Atherosclerosis of both carotid arteries Recommend:  Given the patient's asymptomatic subcritical stenosis no further invasive testing or surgery at this time.  Carotid duplex shows RICA 1-61% and LICA 09-60%  Continue antiplatelet therapy as prescribed Continue management of CAD, HTN and Hyperlipidemia Healthy heart diet,  encouraged exercise at least 4 times per week Follow up in 12 months with duplex ultrasound and physical exam  - VAS US CAROTID; Future  3. Essential hypertension Continue antihypertensive medications as already ordered, these medications have been reviewed and there are no changes at this time.  4. Type 2 diabetes mellitus with other specified complication, unspecified whether long term insulin use (HCC) Continue hypoglycemic medications  as already ordered, these medications have been reviewed and there are no changes at this time.  Hgb A1C to be monitored as already arranged by primary service  5. Mixed hyperlipidemia Continue statin as ordered and reviewed, no changes at this time    Hortencia Pilar, MD  02/17/2022 3:47 PM

## 2022-02-18 ENCOUNTER — Ambulatory Visit (INDEPENDENT_AMBULATORY_CARE_PROVIDER_SITE_OTHER): Payer: Medicare PPO

## 2022-02-18 ENCOUNTER — Ambulatory Visit (INDEPENDENT_AMBULATORY_CARE_PROVIDER_SITE_OTHER): Payer: Medicare PPO | Admitting: Vascular Surgery

## 2022-02-18 ENCOUNTER — Encounter (INDEPENDENT_AMBULATORY_CARE_PROVIDER_SITE_OTHER): Payer: Self-pay | Admitting: Vascular Surgery

## 2022-02-18 VITALS — BP 146/72 | HR 94 | Ht 65.0 in | Wt 139.0 lb

## 2022-02-18 DIAGNOSIS — E1169 Type 2 diabetes mellitus with other specified complication: Secondary | ICD-10-CM | POA: Diagnosis not present

## 2022-02-18 DIAGNOSIS — I70213 Atherosclerosis of native arteries of extremities with intermittent claudication, bilateral legs: Secondary | ICD-10-CM

## 2022-02-18 DIAGNOSIS — I6523 Occlusion and stenosis of bilateral carotid arteries: Secondary | ICD-10-CM

## 2022-02-18 DIAGNOSIS — I10 Essential (primary) hypertension: Secondary | ICD-10-CM | POA: Diagnosis not present

## 2022-02-18 DIAGNOSIS — E782 Mixed hyperlipidemia: Secondary | ICD-10-CM

## 2022-02-24 ENCOUNTER — Encounter (INDEPENDENT_AMBULATORY_CARE_PROVIDER_SITE_OTHER): Payer: Self-pay | Admitting: Vascular Surgery

## 2022-03-19 DIAGNOSIS — R197 Diarrhea, unspecified: Secondary | ICD-10-CM | POA: Diagnosis not present

## 2022-03-25 DIAGNOSIS — E1142 Type 2 diabetes mellitus with diabetic polyneuropathy: Secondary | ICD-10-CM | POA: Diagnosis not present

## 2022-04-10 DIAGNOSIS — G63 Polyneuropathy in diseases classified elsewhere: Secondary | ICD-10-CM | POA: Diagnosis not present

## 2022-04-10 DIAGNOSIS — I739 Peripheral vascular disease, unspecified: Secondary | ICD-10-CM | POA: Diagnosis not present

## 2022-04-10 DIAGNOSIS — Z794 Long term (current) use of insulin: Secondary | ICD-10-CM | POA: Diagnosis not present

## 2022-04-10 DIAGNOSIS — I10 Essential (primary) hypertension: Secondary | ICD-10-CM | POA: Diagnosis not present

## 2022-04-10 DIAGNOSIS — E785 Hyperlipidemia, unspecified: Secondary | ICD-10-CM | POA: Diagnosis not present

## 2022-04-10 DIAGNOSIS — E1169 Type 2 diabetes mellitus with other specified complication: Secondary | ICD-10-CM | POA: Diagnosis not present

## 2022-04-22 DIAGNOSIS — I152 Hypertension secondary to endocrine disorders: Secondary | ICD-10-CM | POA: Diagnosis not present

## 2022-04-22 DIAGNOSIS — E785 Hyperlipidemia, unspecified: Secondary | ICD-10-CM | POA: Diagnosis not present

## 2022-04-22 DIAGNOSIS — Z794 Long term (current) use of insulin: Secondary | ICD-10-CM | POA: Diagnosis not present

## 2022-04-22 DIAGNOSIS — E559 Vitamin D deficiency, unspecified: Secondary | ICD-10-CM | POA: Diagnosis not present

## 2022-04-22 DIAGNOSIS — E1142 Type 2 diabetes mellitus with diabetic polyneuropathy: Secondary | ICD-10-CM | POA: Diagnosis not present

## 2022-04-22 DIAGNOSIS — E1159 Type 2 diabetes mellitus with other circulatory complications: Secondary | ICD-10-CM | POA: Diagnosis not present

## 2022-04-22 DIAGNOSIS — E1169 Type 2 diabetes mellitus with other specified complication: Secondary | ICD-10-CM | POA: Diagnosis not present

## 2022-04-25 DIAGNOSIS — E1142 Type 2 diabetes mellitus with diabetic polyneuropathy: Secondary | ICD-10-CM | POA: Diagnosis not present

## 2022-05-24 DIAGNOSIS — E1142 Type 2 diabetes mellitus with diabetic polyneuropathy: Secondary | ICD-10-CM | POA: Diagnosis not present

## 2022-06-03 DIAGNOSIS — K529 Noninfective gastroenteritis and colitis, unspecified: Secondary | ICD-10-CM | POA: Diagnosis not present

## 2022-06-24 DIAGNOSIS — E1142 Type 2 diabetes mellitus with diabetic polyneuropathy: Secondary | ICD-10-CM | POA: Diagnosis not present

## 2022-06-25 DIAGNOSIS — Z09 Encounter for follow-up examination after completed treatment for conditions other than malignant neoplasm: Secondary | ICD-10-CM | POA: Diagnosis not present

## 2022-06-25 DIAGNOSIS — E1122 Type 2 diabetes mellitus with diabetic chronic kidney disease: Secondary | ICD-10-CM | POA: Diagnosis not present

## 2022-06-25 DIAGNOSIS — N183 Chronic kidney disease, stage 3 unspecified: Secondary | ICD-10-CM | POA: Diagnosis not present

## 2022-06-25 DIAGNOSIS — I10 Essential (primary) hypertension: Secondary | ICD-10-CM | POA: Diagnosis not present

## 2022-06-25 DIAGNOSIS — E119 Type 2 diabetes mellitus without complications: Secondary | ICD-10-CM | POA: Diagnosis not present

## 2022-06-25 DIAGNOSIS — E785 Hyperlipidemia, unspecified: Secondary | ICD-10-CM | POA: Diagnosis not present

## 2022-06-25 DIAGNOSIS — K2100 Gastro-esophageal reflux disease with esophagitis, without bleeding: Secondary | ICD-10-CM | POA: Diagnosis not present

## 2022-06-25 DIAGNOSIS — E1169 Type 2 diabetes mellitus with other specified complication: Secondary | ICD-10-CM | POA: Diagnosis not present

## 2022-06-25 DIAGNOSIS — I129 Hypertensive chronic kidney disease with stage 1 through stage 4 chronic kidney disease, or unspecified chronic kidney disease: Secondary | ICD-10-CM | POA: Diagnosis not present

## 2022-06-25 DIAGNOSIS — I272 Pulmonary hypertension, unspecified: Secondary | ICD-10-CM | POA: Diagnosis not present

## 2022-06-25 DIAGNOSIS — Z794 Long term (current) use of insulin: Secondary | ICD-10-CM | POA: Diagnosis not present

## 2022-06-25 DIAGNOSIS — M8589 Other specified disorders of bone density and structure, multiple sites: Secondary | ICD-10-CM | POA: Diagnosis not present

## 2022-07-11 DIAGNOSIS — I272 Pulmonary hypertension, unspecified: Secondary | ICD-10-CM | POA: Diagnosis not present

## 2022-07-11 DIAGNOSIS — Z86711 Personal history of pulmonary embolism: Secondary | ICD-10-CM | POA: Diagnosis not present

## 2022-07-11 DIAGNOSIS — R0602 Shortness of breath: Secondary | ICD-10-CM | POA: Diagnosis not present

## 2022-07-17 ENCOUNTER — Other Ambulatory Visit: Payer: Self-pay | Admitting: Pulmonary Disease

## 2022-07-17 DIAGNOSIS — R0602 Shortness of breath: Secondary | ICD-10-CM

## 2022-07-17 DIAGNOSIS — Z86711 Personal history of pulmonary embolism: Secondary | ICD-10-CM

## 2022-07-24 DIAGNOSIS — E1142 Type 2 diabetes mellitus with diabetic polyneuropathy: Secondary | ICD-10-CM | POA: Diagnosis not present

## 2022-07-31 ENCOUNTER — Ambulatory Visit
Admission: RE | Admit: 2022-07-31 | Discharge: 2022-07-31 | Disposition: A | Discharge status: Home / Self Care | Payer: Medicare PPO | Source: Ambulatory Visit | Attending: Pulmonary Disease | Admitting: Pulmonary Disease

## 2022-07-31 ENCOUNTER — Other Ambulatory Visit: Payer: Medicare PPO

## 2022-07-31 DIAGNOSIS — R0602 Shortness of breath: Secondary | ICD-10-CM | POA: Diagnosis not present

## 2022-07-31 DIAGNOSIS — Z86711 Personal history of pulmonary embolism: Secondary | ICD-10-CM | POA: Diagnosis not present

## 2022-07-31 LAB — POCT I-STAT CREATININE: Creatinine, Ser: 1.3 mg/dL — ABNORMAL HIGH (ref 0.44–1.00)

## 2022-07-31 MED ORDER — IOHEXOL 350 MG/ML SOLN
100.0000 mL | Freq: Once | INTRAVENOUS | Status: AC | PRN
  Administered 2022-07-31: 60 mL via INTRAVENOUS

## 2022-08-08 DIAGNOSIS — I10 Essential (primary) hypertension: Secondary | ICD-10-CM | POA: Diagnosis not present

## 2022-08-08 DIAGNOSIS — K2100 Gastro-esophageal reflux disease with esophagitis, without bleeding: Secondary | ICD-10-CM | POA: Diagnosis not present

## 2022-08-08 DIAGNOSIS — I272 Pulmonary hypertension, unspecified: Secondary | ICD-10-CM | POA: Diagnosis not present

## 2022-08-08 DIAGNOSIS — E119 Type 2 diabetes mellitus without complications: Secondary | ICD-10-CM | POA: Diagnosis not present

## 2022-08-08 DIAGNOSIS — I2510 Atherosclerotic heart disease of native coronary artery without angina pectoris: Secondary | ICD-10-CM | POA: Diagnosis not present

## 2022-08-08 DIAGNOSIS — E1169 Type 2 diabetes mellitus with other specified complication: Secondary | ICD-10-CM | POA: Diagnosis not present

## 2022-08-08 DIAGNOSIS — E785 Hyperlipidemia, unspecified: Secondary | ICD-10-CM | POA: Diagnosis not present

## 2022-08-08 DIAGNOSIS — I6529 Occlusion and stenosis of unspecified carotid artery: Secondary | ICD-10-CM | POA: Diagnosis not present

## 2022-08-08 DIAGNOSIS — I739 Peripheral vascular disease, unspecified: Secondary | ICD-10-CM | POA: Diagnosis not present

## 2022-08-21 DIAGNOSIS — K8681 Exocrine pancreatic insufficiency: Secondary | ICD-10-CM | POA: Diagnosis not present

## 2022-08-24 DIAGNOSIS — E1142 Type 2 diabetes mellitus with diabetic polyneuropathy: Secondary | ICD-10-CM | POA: Diagnosis not present

## 2022-09-23 DIAGNOSIS — E1142 Type 2 diabetes mellitus with diabetic polyneuropathy: Secondary | ICD-10-CM | POA: Diagnosis not present

## 2022-10-21 DIAGNOSIS — E785 Hyperlipidemia, unspecified: Secondary | ICD-10-CM | POA: Diagnosis not present

## 2022-10-21 DIAGNOSIS — E1169 Type 2 diabetes mellitus with other specified complication: Secondary | ICD-10-CM | POA: Diagnosis not present

## 2022-10-21 DIAGNOSIS — Z794 Long term (current) use of insulin: Secondary | ICD-10-CM | POA: Diagnosis not present

## 2022-10-21 DIAGNOSIS — E1159 Type 2 diabetes mellitus with other circulatory complications: Secondary | ICD-10-CM | POA: Diagnosis not present

## 2022-10-21 DIAGNOSIS — E559 Vitamin D deficiency, unspecified: Secondary | ICD-10-CM | POA: Diagnosis not present

## 2022-10-21 DIAGNOSIS — I152 Hypertension secondary to endocrine disorders: Secondary | ICD-10-CM | POA: Diagnosis not present

## 2022-10-21 DIAGNOSIS — E1142 Type 2 diabetes mellitus with diabetic polyneuropathy: Secondary | ICD-10-CM | POA: Diagnosis not present

## 2022-10-24 DIAGNOSIS — E1142 Type 2 diabetes mellitus with diabetic polyneuropathy: Secondary | ICD-10-CM | POA: Diagnosis not present

## 2022-11-24 DIAGNOSIS — E1142 Type 2 diabetes mellitus with diabetic polyneuropathy: Secondary | ICD-10-CM | POA: Diagnosis not present

## 2022-11-26 DIAGNOSIS — I2699 Other pulmonary embolism without acute cor pulmonale: Secondary | ICD-10-CM | POA: Diagnosis not present

## 2022-12-24 DIAGNOSIS — E1142 Type 2 diabetes mellitus with diabetic polyneuropathy: Secondary | ICD-10-CM | POA: Diagnosis not present

## 2022-12-26 DIAGNOSIS — E785 Hyperlipidemia, unspecified: Secondary | ICD-10-CM | POA: Diagnosis not present

## 2022-12-26 DIAGNOSIS — K8681 Exocrine pancreatic insufficiency: Secondary | ICD-10-CM | POA: Diagnosis not present

## 2022-12-26 DIAGNOSIS — M8589 Other specified disorders of bone density and structure, multiple sites: Secondary | ICD-10-CM | POA: Diagnosis not present

## 2022-12-26 DIAGNOSIS — Z79899 Other long term (current) drug therapy: Secondary | ICD-10-CM | POA: Diagnosis not present

## 2022-12-26 DIAGNOSIS — Z0000 Encounter for general adult medical examination without abnormal findings: Secondary | ICD-10-CM | POA: Diagnosis not present

## 2022-12-26 DIAGNOSIS — E559 Vitamin D deficiency, unspecified: Secondary | ICD-10-CM | POA: Diagnosis not present

## 2022-12-26 DIAGNOSIS — Z86711 Personal history of pulmonary embolism: Secondary | ICD-10-CM | POA: Diagnosis not present

## 2022-12-26 DIAGNOSIS — I1310 Hypertensive heart and chronic kidney disease without heart failure, with stage 1 through stage 4 chronic kidney disease, or unspecified chronic kidney disease: Secondary | ICD-10-CM | POA: Diagnosis not present

## 2022-12-26 DIAGNOSIS — E1169 Type 2 diabetes mellitus with other specified complication: Secondary | ICD-10-CM | POA: Diagnosis not present

## 2022-12-26 DIAGNOSIS — E1142 Type 2 diabetes mellitus with diabetic polyneuropathy: Secondary | ICD-10-CM | POA: Diagnosis not present

## 2022-12-26 DIAGNOSIS — Z803 Family history of malignant neoplasm of breast: Secondary | ICD-10-CM | POA: Diagnosis not present

## 2022-12-26 DIAGNOSIS — E1151 Type 2 diabetes mellitus with diabetic peripheral angiopathy without gangrene: Secondary | ICD-10-CM | POA: Diagnosis not present

## 2022-12-26 DIAGNOSIS — N183 Chronic kidney disease, stage 3 unspecified: Secondary | ICD-10-CM | POA: Diagnosis not present

## 2022-12-26 DIAGNOSIS — Z794 Long term (current) use of insulin: Secondary | ICD-10-CM | POA: Diagnosis not present

## 2022-12-26 DIAGNOSIS — I10 Essential (primary) hypertension: Secondary | ICD-10-CM | POA: Diagnosis not present

## 2022-12-26 DIAGNOSIS — E119 Type 2 diabetes mellitus without complications: Secondary | ICD-10-CM | POA: Diagnosis not present

## 2022-12-26 DIAGNOSIS — I272 Pulmonary hypertension, unspecified: Secondary | ICD-10-CM | POA: Diagnosis not present

## 2023-01-14 DIAGNOSIS — E119 Type 2 diabetes mellitus without complications: Secondary | ICD-10-CM | POA: Diagnosis not present

## 2023-01-14 DIAGNOSIS — H43813 Vitreous degeneration, bilateral: Secondary | ICD-10-CM | POA: Diagnosis not present

## 2023-01-14 DIAGNOSIS — Z961 Presence of intraocular lens: Secondary | ICD-10-CM | POA: Diagnosis not present

## 2023-01-14 DIAGNOSIS — Z83518 Family history of other specified eye disorder: Secondary | ICD-10-CM | POA: Diagnosis not present

## 2023-01-24 DIAGNOSIS — E1142 Type 2 diabetes mellitus with diabetic polyneuropathy: Secondary | ICD-10-CM | POA: Diagnosis not present

## 2023-02-20 ENCOUNTER — Ambulatory Visit (INDEPENDENT_AMBULATORY_CARE_PROVIDER_SITE_OTHER): Payer: Medicare PPO | Admitting: Vascular Surgery

## 2023-02-20 ENCOUNTER — Encounter (INDEPENDENT_AMBULATORY_CARE_PROVIDER_SITE_OTHER): Payer: Medicare PPO

## 2023-02-23 DIAGNOSIS — E1142 Type 2 diabetes mellitus with diabetic polyneuropathy: Secondary | ICD-10-CM | POA: Diagnosis not present

## 2023-03-26 DIAGNOSIS — E1142 Type 2 diabetes mellitus with diabetic polyneuropathy: Secondary | ICD-10-CM | POA: Diagnosis not present

## 2023-04-21 DIAGNOSIS — R0609 Other forms of dyspnea: Secondary | ICD-10-CM | POA: Diagnosis not present

## 2023-04-26 DIAGNOSIS — E1142 Type 2 diabetes mellitus with diabetic polyneuropathy: Secondary | ICD-10-CM | POA: Diagnosis not present

## 2023-05-05 DIAGNOSIS — E1159 Type 2 diabetes mellitus with other circulatory complications: Secondary | ICD-10-CM | POA: Diagnosis not present

## 2023-05-05 DIAGNOSIS — E1169 Type 2 diabetes mellitus with other specified complication: Secondary | ICD-10-CM | POA: Diagnosis not present

## 2023-05-05 DIAGNOSIS — E1142 Type 2 diabetes mellitus with diabetic polyneuropathy: Secondary | ICD-10-CM | POA: Diagnosis not present

## 2023-05-05 DIAGNOSIS — E785 Hyperlipidemia, unspecified: Secondary | ICD-10-CM | POA: Diagnosis not present

## 2023-05-05 DIAGNOSIS — I152 Hypertension secondary to endocrine disorders: Secondary | ICD-10-CM | POA: Diagnosis not present

## 2023-05-05 DIAGNOSIS — Z794 Long term (current) use of insulin: Secondary | ICD-10-CM | POA: Diagnosis not present

## 2023-05-06 DIAGNOSIS — E1169 Type 2 diabetes mellitus with other specified complication: Secondary | ICD-10-CM | POA: Diagnosis not present

## 2023-05-06 DIAGNOSIS — N183 Chronic kidney disease, stage 3 unspecified: Secondary | ICD-10-CM | POA: Diagnosis not present

## 2023-05-06 DIAGNOSIS — E1142 Type 2 diabetes mellitus with diabetic polyneuropathy: Secondary | ICD-10-CM | POA: Diagnosis not present

## 2023-05-06 DIAGNOSIS — I10 Essential (primary) hypertension: Secondary | ICD-10-CM | POA: Diagnosis not present

## 2023-05-06 DIAGNOSIS — E785 Hyperlipidemia, unspecified: Secondary | ICD-10-CM | POA: Diagnosis not present

## 2023-05-12 DIAGNOSIS — J432 Centrilobular emphysema: Secondary | ICD-10-CM | POA: Diagnosis not present

## 2023-05-12 DIAGNOSIS — I272 Pulmonary hypertension, unspecified: Secondary | ICD-10-CM | POA: Diagnosis not present

## 2023-05-12 DIAGNOSIS — J9611 Chronic respiratory failure with hypoxia: Secondary | ICD-10-CM | POA: Diagnosis not present

## 2023-05-13 ENCOUNTER — Other Ambulatory Visit: Payer: Self-pay | Admitting: Pulmonary Disease

## 2023-05-13 ENCOUNTER — Other Ambulatory Visit

## 2023-05-13 DIAGNOSIS — J9611 Chronic respiratory failure with hypoxia: Secondary | ICD-10-CM

## 2023-05-13 DIAGNOSIS — R0600 Dyspnea, unspecified: Secondary | ICD-10-CM

## 2023-05-15 ENCOUNTER — Encounter
Admission: RE | Admit: 2023-05-15 | Discharge: 2023-05-15 | Disposition: A | Discharge status: Home / Self Care | Source: Ambulatory Visit | Attending: Pulmonary Disease | Admitting: Pulmonary Disease

## 2023-05-15 ENCOUNTER — Ambulatory Visit
Admission: RE | Admit: 2023-05-15 | Discharge: 2023-05-15 | Disposition: A | Discharge status: Home / Self Care | Source: Ambulatory Visit | Attending: Pulmonary Disease | Admitting: Pulmonary Disease

## 2023-05-15 DIAGNOSIS — R0600 Dyspnea, unspecified: Secondary | ICD-10-CM

## 2023-05-15 DIAGNOSIS — R0902 Hypoxemia: Secondary | ICD-10-CM | POA: Diagnosis not present

## 2023-05-15 DIAGNOSIS — J9611 Chronic respiratory failure with hypoxia: Secondary | ICD-10-CM

## 2023-05-15 MED ORDER — TECHNETIUM TO 99M ALBUMIN AGGREGATED
4.0000 | Freq: Once | INTRAVENOUS | Status: AC | PRN
Start: 2023-05-15 — End: 2023-05-15
  Administered 2023-05-15: 4.07 via INTRAVENOUS

## 2023-05-24 DIAGNOSIS — E1142 Type 2 diabetes mellitus with diabetic polyneuropathy: Secondary | ICD-10-CM | POA: Diagnosis not present

## 2023-05-26 DIAGNOSIS — S4992XA Unspecified injury of left shoulder and upper arm, initial encounter: Secondary | ICD-10-CM | POA: Diagnosis not present

## 2023-05-26 DIAGNOSIS — M25512 Pain in left shoulder: Secondary | ICD-10-CM | POA: Diagnosis not present

## 2023-05-28 DIAGNOSIS — S46012A Strain of muscle(s) and tendon(s) of the rotator cuff of left shoulder, initial encounter: Secondary | ICD-10-CM | POA: Diagnosis not present

## 2023-05-28 DIAGNOSIS — M19012 Primary osteoarthritis, left shoulder: Secondary | ICD-10-CM | POA: Diagnosis not present

## 2023-05-28 DIAGNOSIS — X500XXA Overexertion from strenuous movement or load, initial encounter: Secondary | ICD-10-CM | POA: Diagnosis not present

## 2023-06-24 ENCOUNTER — Ambulatory Visit: Attending: Hand Surgery

## 2023-06-24 DIAGNOSIS — M25512 Pain in left shoulder: Secondary | ICD-10-CM | POA: Insufficient documentation

## 2023-06-24 DIAGNOSIS — G8929 Other chronic pain: Secondary | ICD-10-CM | POA: Insufficient documentation

## 2023-06-24 DIAGNOSIS — M25612 Stiffness of left shoulder, not elsewhere classified: Secondary | ICD-10-CM | POA: Diagnosis not present

## 2023-06-24 DIAGNOSIS — E1142 Type 2 diabetes mellitus with diabetic polyneuropathy: Secondary | ICD-10-CM | POA: Diagnosis not present

## 2023-06-24 DIAGNOSIS — M6281 Muscle weakness (generalized): Secondary | ICD-10-CM | POA: Insufficient documentation

## 2023-06-24 NOTE — Therapy (Signed)
 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, neck, and leg   Diabetes mellitus without complication (HCC)    GERD (gastroesophageal reflux disease)    Hyperlipidemia    Hypertension    Peripheral neuropathy    Peripheral vascular disease (HCC)    Past Surgical History:  Procedure Laterality Date   APPENDECTOMY     BREAST BIOPSY Left    neg   PERIPHERAL VASCULAR CATHETERIZATION Right 11/07/2015   Procedure: Lower Extremity Angiography;  Surgeon: Jackquelyn Mass, MD;  Location: [facility];  Service: Cardiovascular;  Laterality: Right;   TONSILLECTOMY     Patient Active Problem List   Diagnosis Date Noted   Pulmonary hypertension (HCC) 11/10/2019   Type 2 diabetes mellitus (HCC) 05/21/2017   Family history of breast cancer in sister 05/08/2017   Impingement syndrome, shoulder, right 11/26/2016   Primary osteoarthritis of right knee 11/26/2016   Right knee pain 11/26/2016   Acute pain of right shoulder 10/29/2016   Tear of biceps tendon 10/29/2016   DJD (degenerative joint disease) 10/14/2016   Atherosclerosis of native arteries of extremity with intermittent claudication (HCC) 12/28/2015   Pain in limb 12/28/2015   Neuropathy of foot, right 12/28/2015   Essential hypertension 12/28/2015   Hyperlipidemia 12/28/2015   Peripheral arterial disease (HCC) 11/20/2015   Hypomagnesemia  09/29/2014   Barrett esophagus 09/23/2013   Carotid atherosclerosis 09/23/2013   CRI (chronic renal insufficiency), stage 3 (moderate) (HCC) 09/23/2013   High risk medication use 09/23/2013   Gastro-esophageal reflux disease with esophagitis 08/30/2013   Hyperlipidemia due to type 2 diabetes mellitus (HCC) 08/30/2013   Osteopenia 08/30/2013   Vasomotor rhinitis 08/30/2013   Vitamin D deficiency 08/30/2013   PCP: Naaman Au NP  REFERRING PROVIDER: Rockney Cid MD  REFERRING DIAG: S46.012A (ICD-10-CM) - Strain of muscle(s) and tendon(s) of the rotator cuff of left shoulder, initial encounter M19.012 (ICD-10-CM) - Primary osteoarthritis, left shoulder  RATIONALE FOR EVALUATION AND TREATMENT: Rehabilitation  THERAPY DIAG: Acute pain of left shoulder  Muscle weakness (generalized)  Stiffness of left shoulder, not elsewhere classified  ONSET DATE: 05/26/23  FOLLOW-UP APPT SCHEDULED WITH REFERRING PROVIDER: No    SUBJECTIVE:  SUBJECTIVE STATEMENT:  L shoulder weakness/pain;   PERTINENT HISTORY:  Pt experienced acute severe shoulder pain following an incident 05/26/23 where she twisted and pushed awkwardly while moving a cat off her lap. She reports immediate severe pain. She went to the urgent care where radiographs did not reveal any fractures but indicated severe arthritis in the [facility] joint and humeral head elevation suggesting damage to the rotator cuff tendons, as the humerus appears to be positioned high riding. She was eventually referred to orthopedics and she was given a L shoulder injection on 05/28/23 which has helped considerably with her pain. She was referred to PT for strengthening secondary to suspected RTC injury. At this time she only reports generalized soreness in the shoulder but  ongoing chronic weakness especially when lifting her arm overhead. She reports difficulty washing and curling her hair.   3/19/202: 4-view (AP, grashey, Y-view, axillary) x-ray left shoulder ordered and personally reviewed in clinic today. They reveal no acute fractures or dislocations. Mild glenohumeral joint space narrowing and slight decrease in acromiohumeral interval and acromioclavicular joint spaces with advanced arthrosis. Soft tissue unremarkable.  PAIN:  Pain Intensity: Present: 0/10, Best: 0/10, Worst: 3/10 Pain location: Superior and lateral aspect of L shoulder Pain Quality: Soreness Radiating: No  Numbness/Tingling: No Focal Weakness: Yes, worsening L shoulder weakness Aggravating factors: Overhead motion, pushing laterally Relieving factors: L shoulder injection, ice; 24-hour pain behavior: Varies based on activity History of prior shoulder or neck/shoulder injury, pain, surgery, or therapy: Yes, history of L RTC tear 8 years ago, placed in sling. No history of therapy; Falls: Has patient fallen in last 6 months? 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  Recent memory is intact.  Remote memory is intact.  Attention span and concentration are intact.  Expressive speech is intact.  Patient's fund of knowledge is within normal limits for educational level.    Gross  Musculoskeletal Assessment Tremor: None Bulk: Normal Tone: Normal  Gait Deferred  Posture Mild forward head but generally no gross deficits contributing to symptoms;   Cervical Screen AROM: WFL and painless with overpressure in all planes with the exception of some mild pain with L lateral flexion; Spurlings A (ipsilateral lateral flexion/axial compression): R: Negative L: Negative Spurlings B (ipsilateral lateral flexion/contralateral rotation/axial compression): R: Negative L: Negative Repeated movement: Deferred Hoffman Sign (cervical cord compression): R: Not examined L: Not examined ULTT Median: R: Not examined L: Not examined ULTT Ulnar: R: Not examined L: Not examined ULTT Radial: R: Not examined L: Not examined  AROM AROM (Normal range in degrees) AROM   Right Left  Shoulder    Flexion 153 134  Extension    Abduction 116 135  External Rotation 70 70  Internal Rotation 57 50  Hands Behind Head T3 C6  Hands Behind Back T10 waistline      Elbow    Flexion WNL WNL  Extension WNL WNL  Pronation WNL WNL  Supination WNL WNL  (* =  pain; Blank rows = not tested)  UE MMT: MMT (out of 5) Right Left   Cervical (isometric)  Flexion WNL  Extension WNL  Lateral Flexion WNL WNL  Rotation WNL WNL      Shoulder   Flexion 4 2*  Extension    Abduction 4* 2*  External rotation 3 2*  Internal rotation 5 5  Horizontal abduction    Horizontal adduction    Lower Trapezius    Rhomboids        Elbow  Flexion 5 5  Extension 5 5  Pronation 5 5  Supination 5 5      Wrist  Flexion 5 5  Extension 5 5  Radial deviation 5 5  Ulnar deviation 5 5      MCP  Flexion 5 5  Extension 5 5  Abduction 5 5  Adduction 5 5  (* = pain; Blank rows = not tested)  Sensation Deferred  Reflexes Deferred  Palpation General tenderness to palpation along anterior, lateral, and posterior shoulder  Passive Accessory Intervertebral Motion Deferred  Accessory  Motions/Glides Deferred  Muscle Length Testing Deferred  SPECIAL TESTS Rotator Cuff  Drop Arm Test: Negative Painful Arc (Pain from 60 to 120 degrees scaption): Positive Infraspinatus Muscle Test: Positive  Subacromial Impingement Hawkins-Kennedy: Not examined Neer (Block scapula, PROM flexion): Positive Painful Arc (Pain from 60 to 120 degrees scaption): Positive Empty Can: Not examined External Rotation Resistance: Positive Horizontal Adduction: Not examined Scapular Assist: Not examined  Labral Tear Biceps Load II (120 elevation, full ER, 90 elbow flexion, full supination, resisted elbow flexion): Not examined Crank (160 scaption, axial load with IR/ER): Not examined O'Briens/Active Compression Test (90 shoulder flexion, 10 adduction, full IR): Not examined  Bicep Tendon Pathology Speed (shoulder flexion to 90, external rotation, full elbow extension, and forearm supination with resistance: Not examined Yergason's (resisted shoulder ER and supination/biceps tendon pathology): Not examined  Shoulder Instability Sulcus Sign: Negative Anterior Apprehension: Negative  Beighton scale Deferred   TODAY'S TREATMENT  Deferred   PATIENT EDUCATION:  Education details: Plan of care Person educated: Patient Education method: Explanation Education comprehension: verbalized understanding   HOME EXERCISE PROGRAM:  Deferred   ASSESSMENT:  CLINICAL IMPRESSION: Patient is a 89 y.o. female who was seen today for physical therapy evaluation and treatment for L shoulder pain. Findings concerning for likely RTC tear. Plan to initiate strengthening and ROM at first follow-up session.  OBJECTIVE IMPAIRMENTS: decreased ROM, decreased strength, impaired UE functional use, postural dysfunction, and pain.   ACTIVITY LIMITATIONS: carrying, lifting, and reach over head  PARTICIPATION LIMITATIONS:  household chores  PERSONAL FACTORS: Age, Past/current experiences, and 3+  comorbidities: osteopenia, DMII, L shoulder OA  are also affecting patient's functional outcome.   REHAB POTENTIAL: Fair    CLINICAL DECISION MAKING: Stable/uncomplicated  EVALUATION COMPLEXITY: Low   GOALS: Goals reviewed with patient? Yes  SHORT TERM GOALS: Target date: 08/05/2023  Pt will be independent with HEP to improve strength and decrease shoulder pain to improve pain-free function at home and with self-care Baseline:  Goal status: INITIAL   LONG TERM GOALS: Target date: 09/16/2023  Pt will decrease quick DASH score by at least 8% in order to demonstrate clinically significant reduction in disability related to shoulder pain. Baseline: 36.4% Goal status: INITIAL  2.  Pt will decrease worst shoulder pain by at least 3 points on the NPRS in order to demonstrate clinically significant reduction in shoulder pain. Baseline: 3/10; Goal status: INITIAL  3. Pt will  increase strength of pain-free L shoulder flexion, abduction and ER to at least 3+/5 MMT grade in order to demonstrate improvement in strength and function         Baseline: 2/5 for flexion, abduction, and ER with pain; Goal status: INITIAL   PLAN: PT FREQUENCY: 2x/week  PT DURATION: 12 weeks  PLANNED INTERVENTIONS: Therapeutic exercises, Therapeutic activity, Neuromuscular re-education, Balance training, Gait training, Patient/Family education, Self Care, Joint mobilization, Joint manipulation, Vestibular training, Canalith repositioning, Orthotic/Fit training, DME instructions, Dry Needling, Electrical stimulation, Spinal manipulation, Spinal mobilization, Cryotherapy, Moist heat, Taping, Traction, Ultrasound, Ionotophoresis 4mg /ml Dexamethasone, Manual therapy, and Re-evaluation.  PLAN FOR NEXT SESSION: Initiate L shoulder manual ROM and strengthening, issue HEP;   Sherill Ding Michaelle Bottomley PT, DPT, GCS  Paola Flynt, PT 06/25/2023, 9:39 PM

## 2023-06-26 ENCOUNTER — Ambulatory Visit

## 2023-06-28 NOTE — Therapy (Unsigned)
 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, neck, and leg   Diabetes mellitus without complication (HCC)    GERD (gastroesophageal reflux disease)    Hyperlipidemia    Hypertension    Peripheral neuropathy    Peripheral vascular disease (HCC)    Past Surgical History:  Procedure Laterality Date   APPENDECTOMY     BREAST BIOPSY Left    neg   PERIPHERAL VASCULAR CATHETERIZATION Right 11/07/2015   Procedure: Lower Extremity Angiography;  Surgeon: Jackquelyn Mass, MD;  Location: [facility] INV[facility];  Service: Cardiovascular;  Laterality: Right;   TONSILLECTOMY     Patient Active Problem List   Diagnosis Date Noted   Pulmonary hypertension (HCC) 11/10/2019   Type 2 diabetes mellitus (HCC) 05/21/2017   Family history of breast cancer in sister 05/08/2017   Impingement syndrome, shoulder, right 11/26/2016   Primary osteoarthritis of right knee 11/26/2016   Right knee pain 11/26/2016   Acute pain of right shoulder 10/29/2016   Tear of biceps tendon 10/29/2016   DJD (degenerative joint disease) 10/14/2016   Atherosclerosis of native arteries of extremity with intermittent claudication (HCC) 12/28/2015   Pain in limb 12/28/2015   Neuropathy of foot, right 12/28/2015   Essential hypertension 12/28/2015   Hyperlipidemia 12/28/2015   Peripheral arterial disease (HCC) 11/20/2015   Hypomagnesemia  09/29/2014   Barrett esophagus 09/23/2013   Carotid atherosclerosis 09/23/2013   CRI (chronic renal insufficiency), stage 3 (moderate) (HCC) 09/23/2013   High risk medication use 09/23/2013   Gastro-esophageal reflux disease with esophagitis 08/30/2013   Hyperlipidemia due to type 2 diabetes mellitus (HCC) 08/30/2013   Osteopenia 08/30/2013   Vasomotor rhinitis 08/30/2013   Vitamin D deficiency 08/30/2013   PCP: Naaman Au NP  REFERRING PROVIDER: Rockney Cid MD  REFERRING DIAG: S46.012A (ICD-10-CM) - Strain of muscle(s) and tendon(s) of the rotator cuff of left shoulder, initial encounter M19.012 (ICD-10-CM) - Primary osteoarthritis, left shoulder  RATIONALE FOR EVALUATION AND TREATMENT: Rehabilitation  THERAPY DIAG: Acute pain of left shoulder  Muscle weakness (generalized)  ONSET DATE: 05/26/23  FOLLOW-UP APPT SCHEDULED WITH REFERRING PROVIDER: No   FROM INITIAL EVALUATION SUBJECTIVE:  SUBJECTIVE STATEMENT:  L shoulder weakness/pain;   PERTINENT HISTORY:  Pt experienced acute severe shoulder pain following an incident 05/26/23 where she twisted and pushed awkwardly while moving a cat off her lap. She reports immediate severe pain. She went to the urgent care where radiographs did not reveal any fractures but indicated severe arthritis in the [facility] joint and humeral head elevation suggesting damage to the rotator cuff tendons, as the humerus appears to be positioned high riding. She was eventually referred to orthopedics and she was given a L shoulder injection on 05/28/23 which has helped considerably with her pain. She was referred to PT for strengthening secondary to suspected RTC injury. At this time she only reports generalized soreness in the shoulder but ongoing chronic weakness especially  when lifting her arm overhead. She reports difficulty washing and curling her hair.   3/19/202: 4-view (AP, grashey, Y-view, axillary) x-ray left shoulder ordered and personally reviewed in clinic today. They reveal no acute fractures or dislocations. Mild glenohumeral joint space narrowing and slight decrease in acromiohumeral interval and acromioclavicular joint spaces with advanced arthrosis. Soft tissue unremarkable.  PAIN:  Pain Intensity: Present: 0/10, Best: 0/10, Worst: 3/10 Pain location: Superior and lateral aspect of L shoulder Pain Quality: Soreness Radiating: No  Numbness/Tingling: No Focal Weakness: Yes, worsening L shoulder weakness Aggravating factors: Overhead motion, pushing laterally Relieving factors: L shoulder injection, ice; 24-hour pain behavior: Varies based on activity History of prior shoulder or neck/shoulder injury, pain, surgery, or therapy: Yes, history of L RTC tear 8 years ago, placed in sling. No history of therapy; Falls: Has patient fallen in last 6 months? 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  Recent memory is intact.  Remote memory is intact.  Attention span and concentration are intact.  Expressive speech is intact.  Patient's fund of knowledge is within normal limits for educational level.    Gross Musculoskeletal Assessment Tremor:  None Bulk: Normal Tone: Normal  Gait Deferred  Posture Mild forward head but generally no gross deficits contributing to symptoms;   Cervical Screen AROM: WFL and painless with overpressure in all planes with the exception of some mild pain with L lateral flexion; Spurlings A (ipsilateral lateral flexion/axial compression): R: Negative L: Negative Spurlings B (ipsilateral lateral flexion/contralateral rotation/axial compression): R: Negative L: Negative Repeated movement: Deferred Hoffman Sign (cervical cord compression): R: Not examined L: Not examined ULTT Median: R: Not examined L: Not examined ULTT Ulnar: R: Not examined L: Not examined ULTT Radial: R: Not examined L: Not examined  AROM AROM (Normal range in degrees) AROM   Right Left  Shoulder    Flexion 153 134  Extension    Abduction 116 135  External Rotation 70 70  Internal Rotation 57 50  Hands Behind Head T3 C6  Hands Behind Back T10 waistline      Elbow    Flexion WNL WNL  Extension WNL WNL  Pronation WNL WNL  Supination WNL WNL  (* =  pain; Blank rows = not tested)  UE MMT: MMT (out of 5) Right Left   Cervical (isometric)  Flexion WNL  Extension WNL  Lateral Flexion WNL WNL  Rotation WNL WNL      Shoulder   Flexion 4 2*  Extension    Abduction 4* 2*  External rotation 3 2*  Internal rotation 5 5  Horizontal abduction    Horizontal adduction    Lower Trapezius    Rhomboids        Elbow  Flexion 5 5  Extension 5 5  Pronation 5 5  Supination 5 5      Wrist  Flexion 5 5  Extension 5 5  Radial deviation 5 5  Ulnar deviation 5 5      MCP  Flexion 5 5  Extension 5 5  Abduction 5 5  Adduction 5 5  (* = pain; Blank rows = not tested)  Sensation Deferred  Reflexes Deferred  Palpation General tenderness to palpation along anterior, lateral, and posterior shoulder  Passive Accessory Intervertebral Motion Deferred  Accessory Motions/Glides Deferred  Muscle Length  Testing Deferred  SPECIAL TESTS Rotator Cuff  Drop Arm Test: Negative Painful Arc (Pain from 60 to 120 degrees scaption): Positive Infraspinatus Muscle Test: Positive  Subacromial Impingement Hawkins-Kennedy: Not examined Neer (Block scapula, PROM flexion): Positive Painful Arc (Pain from 60 to 120 degrees scaption): Positive Empty Can: Not examined External Rotation Resistance: Positive Horizontal Adduction: Not examined Scapular Assist: Not examined  Labral Tear Biceps Load II (120 elevation, full ER, 90 elbow flexion, full supination, resisted elbow flexion): Not examined Crank (160 scaption, axial load with IR/ER): Not examined O'Briens/Active Compression Test (90 shoulder flexion, 10 adduction, full IR): Not examined  Bicep Tendon Pathology Speed (shoulder flexion to 90, external rotation, full elbow extension, and forearm supination with resistance: Not examined Yergason's (resisted shoulder ER and supination/biceps tendon pathology): Not examined  Shoulder Instability Sulcus Sign: Negative Anterior Apprehension: Negative  Beighton scale Deferred   TODAY'S TREATMENT   SUBJECTIVE: Pt reports that she is doing well today. Mild soreness after the initial evaluation but otherwise no significant changes. Mild soreness upon arrival today but denies pain. No specific questions or concerns.    PAIN: "soreness" but no pain;   Ther-ex  L shoulder isometrics for flexion, extension, abduction, adduction, ER, and IR 3-5s hold 2 x 10 each direction; Manually resisted L elbow flexion and extension x 10 each; Manually resisted L forearm pronation and supination x 10 each; HEP issued and reviewed with patient;   Manual Therapy   L shoulder PROM for flexion, scaption, ER, and IR x multiple bouts each direction;   PATIENT EDUCATION:  Education details: Plan of care Person educated: Patient Education method: Explanation Education comprehension: verbalized  understanding   HOME EXERCISE PROGRAM:  Access Code: [email or web address] URL: [email or web address] Date: 07/01/2023 Prepared by: Crawford Dock  Exercises - Seated Scapular Retraction  - 1 x daily - 7 x weekly - 2 sets - 10 reps - 3s hold - Standing Isometric Shoulder Flexion with Doorway - Arm Bent (Mirrored)  - 1 x daily - 7 x weekly - 2 sets - 10 reps - 3s hold - Standing Isometric Shoulder Abduction with Doorway - Arm Bent  - 1 x daily - 7 x weekly - 2 sets - 10 reps - 3s hold - Standing Isometric Shoulder Extension with Doorway - Arm Bent (Mirrored)  - 1 x daily - 7 x weekly - 2 sets - 10 reps -  3s hold   ASSESSMENT:  CLINICAL IMPRESSION: Initiated PROM and isometric L shoulder strengthening exercises during session today. Minimal pain noted during PROM at end range flexion, scaption, and ER. Issued HEP for scapular retractions and L shoulder isometric strengthening at home. Pt encouraged to follow-up as scheduled. She will benefit from PT services to address deficits in strength, balance, and mobility in order to return to full function at home and decrease his risk for falls.    OBJECTIVE IMPAIRMENTS: decreased ROM, decreased strength, impaired UE functional use, postural dysfunction, and pain.   ACTIVITY LIMITATIONS: carrying, lifting, and reach over head  PARTICIPATION LIMITATIONS:  household chores  PERSONAL FACTORS: Age, Past/current experiences, and 3+ comorbidities: osteopenia, DMII, L shoulder OA  are also affecting patient's functional outcome.   REHAB POTENTIAL: Fair    CLINICAL DECISION MAKING: Stable/uncomplicated  EVALUATION COMPLEXITY: Low   GOALS: Goals reviewed with patient? Yes  SHORT TERM GOALS: Target date: 08/05/2023  Pt will be independent with HEP to improve strength and decrease shoulder pain to improve pain-free function at home and with self-care Baseline:  Goal status: INITIAL   LONG TERM GOALS: Target date: 09/16/2023  Pt will decrease  quick DASH score by at least 8% in order to demonstrate clinically significant reduction in disability related to shoulder pain. Baseline: 36.4% Goal status: INITIAL  2.  Pt will decrease worst shoulder pain by at least 3 points on the NPRS in order to demonstrate clinically significant reduction in shoulder pain. Baseline: 3/10; Goal status: INITIAL  3. Pt will increase strength of pain-free L shoulder flexion, abduction and ER to at least 3+/5 MMT grade in order to demonstrate improvement in strength and function         Baseline: 2/5 for flexion, abduction, and ER with pain; Goal status: INITIAL   PLAN: PT FREQUENCY: 2x/week  PT DURATION: 12 weeks  PLANNED INTERVENTIONS: Therapeutic exercises, Therapeutic activity, Neuromuscular re-education, Balance training, Gait training, Patient/Family education, Self Care, Joint mobilization, Joint manipulation, Vestibular training, Canalith repositioning, Orthotic/Fit training, DME instructions, Dry Needling, Electrical stimulation, Spinal manipulation, Spinal mobilization, Cryotherapy, Moist heat, Taping, Traction, Ultrasound, Ionotophoresis 4mg /ml Dexamethasone, Manual therapy, and Re-evaluation.  PLAN FOR NEXT SESSION: Progress L shoulder ROM and strengthening, modify/review HEP as necessary;   Sherill Ding Henryetta Corriveau PT, DPT, GCS  Yavuz Kirby, PT 07/02/2023, 10:48 AM

## 2023-07-01 ENCOUNTER — Ambulatory Visit

## 2023-07-01 DIAGNOSIS — G8929 Other chronic pain: Secondary | ICD-10-CM | POA: Diagnosis not present

## 2023-07-01 DIAGNOSIS — M25512 Pain in left shoulder: Secondary | ICD-10-CM

## 2023-07-01 DIAGNOSIS — M25612 Stiffness of left shoulder, not elsewhere classified: Secondary | ICD-10-CM | POA: Diagnosis not present

## 2023-07-01 DIAGNOSIS — M6281 Muscle weakness (generalized): Secondary | ICD-10-CM

## 2023-07-03 ENCOUNTER — Encounter

## 2023-07-08 ENCOUNTER — Ambulatory Visit

## 2023-07-08 DIAGNOSIS — M6281 Muscle weakness (generalized): Secondary | ICD-10-CM

## 2023-07-08 DIAGNOSIS — M25612 Stiffness of left shoulder, not elsewhere classified: Secondary | ICD-10-CM | POA: Diagnosis not present

## 2023-07-08 DIAGNOSIS — G8929 Other chronic pain: Secondary | ICD-10-CM | POA: Diagnosis not present

## 2023-07-08 DIAGNOSIS — M25512 Pain in left shoulder: Secondary | ICD-10-CM

## 2023-07-08 NOTE — Therapy (Signed)
 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, neck, and leg   Diabetes mellitus without complication (HCC)    GERD (gastroesophageal reflux disease)    Hyperlipidemia    Hypertension    Peripheral neuropathy    Peripheral vascular disease (HCC)    Past Surgical History:  Procedure Laterality Date   APPENDECTOMY     BREAST BIOPSY Left    neg   PERIPHERAL VASCULAR CATHETERIZATION Right 11/07/2015   Procedure: Lower Extremity Angiography;  Surgeon: Jackquelyn Mass, MD;  Location: [facility];  Service: Cardiovascular;  Laterality: Right;   TONSILLECTOMY     Patient Active Problem List   Diagnosis Date Noted   Pulmonary hypertension (HCC) 11/10/2019   Type 2 diabetes mellitus (HCC) 05/21/2017   Family history of breast cancer in sister 05/08/2017   Impingement syndrome, shoulder, right 11/26/2016   Primary osteoarthritis of right knee 11/26/2016   Right knee pain 11/26/2016   Acute pain of right shoulder 10/29/2016   Tear of biceps tendon 10/29/2016   DJD (degenerative joint disease) 10/14/2016   Atherosclerosis of native arteries of extremity with intermittent claudication (HCC) 12/28/2015   Pain in limb 12/28/2015   Neuropathy of foot, right 12/28/2015   Essential hypertension 12/28/2015   Hyperlipidemia 12/28/2015   Peripheral arterial disease (HCC) 11/20/2015   Hypomagnesemia  09/29/2014   Barrett esophagus 09/23/2013   Carotid atherosclerosis 09/23/2013   CRI (chronic renal insufficiency), stage 3 (moderate) (HCC) 09/23/2013   High risk medication use 09/23/2013   Gastro-esophageal reflux disease with esophagitis 08/30/2013   Hyperlipidemia due to type 2 diabetes mellitus (HCC) 08/30/2013   Osteopenia 08/30/2013   Vasomotor rhinitis 08/30/2013   Vitamin D deficiency 08/30/2013   PCP: Naaman Au NP  REFERRING PROVIDER: Rockney Cid MD  REFERRING DIAG: S46.012A (ICD-10-CM) - Strain of muscle(s) and tendon(s) of the rotator cuff of left shoulder, initial encounter M19.012 (ICD-10-CM) - Primary osteoarthritis, left shoulder  RATIONALE FOR EVALUATION AND TREATMENT: Rehabilitation  THERAPY DIAG: Acute pain of left shoulder  Muscle weakness (generalized)  ONSET DATE: 05/26/23  FOLLOW-UP APPT SCHEDULED WITH REFERRING PROVIDER: No   FROM INITIAL EVALUATION SUBJECTIVE:  SUBJECTIVE STATEMENT:  L shoulder weakness/pain;   PERTINENT HISTORY:  Pt experienced acute severe shoulder pain following an incident 05/26/23 where she twisted and pushed awkwardly while moving a cat off her lap. She reports immediate severe pain. She went to the urgent care where radiographs did not reveal any fractures but indicated severe arthritis in the [facility] joint and humeral head elevation suggesting damage to the rotator cuff tendons, as the humerus appears to be positioned high riding. She was eventually referred to orthopedics and she was given a L shoulder injection on 05/28/23 which has helped considerably with her pain. She was referred to PT for strengthening secondary to suspected RTC injury. At this time she only reports generalized soreness in the shoulder but ongoing chronic weakness especially  when lifting her arm overhead. She reports difficulty washing and curling her hair.   3/19/202: 4-view (AP, grashey, Y-view, axillary) x-ray left shoulder ordered and personally reviewed in clinic today. They reveal no acute fractures or dislocations. Mild glenohumeral joint space narrowing and slight decrease in acromiohumeral interval and acromioclavicular joint spaces with advanced arthrosis. Soft tissue unremarkable.  PAIN:  Pain Intensity: Present: 0/10, Best: 0/10, Worst: 3/10 Pain location: Superior and lateral aspect of L shoulder Pain Quality: Soreness Radiating: No  Numbness/Tingling: No Focal Weakness: Yes, worsening L shoulder weakness Aggravating factors: Overhead motion, pushing laterally Relieving factors: L shoulder injection, ice; 24-hour pain behavior: Varies based on activity History of prior shoulder or neck/shoulder injury, pain, surgery, or therapy: Yes, history of L RTC tear 8 years ago, placed in sling. No history of therapy; Falls: Has patient fallen in last 6 months? 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  Recent memory is intact.  Remote memory is intact.  Attention span and concentration are intact.  Expressive speech is intact.  Patient's fund of knowledge is within normal limits for educational level.    Gross Musculoskeletal Assessment Tremor:  None Bulk: Normal Tone: Normal  Gait Deferred  Posture Mild forward head but generally no gross deficits contributing to symptoms;   Cervical Screen AROM: WFL and painless with overpressure in all planes with the exception of some mild pain with L lateral flexion; Spurlings A (ipsilateral lateral flexion/axial compression): R: Negative L: Negative Spurlings B (ipsilateral lateral flexion/contralateral rotation/axial compression): R: Negative L: Negative Repeated movement: Deferred Hoffman Sign (cervical cord compression): R: Not examined L: Not examined ULTT Median: R: Not examined L: Not examined ULTT Ulnar: R: Not examined L: Not examined ULTT Radial: R: Not examined L: Not examined  AROM AROM (Normal range in degrees) AROM   Right Left  Shoulder    Flexion 153 134  Extension    Abduction 116 135  External Rotation 70 70  Internal Rotation 57 50  Hands Behind Head T3 C6  Hands Behind Back T10 waistline      Elbow    Flexion WNL WNL  Extension WNL WNL  Pronation WNL WNL  Supination WNL WNL  (* =  pain; Blank rows = not tested)  UE MMT: MMT (out of 5) Right Left   Cervical (isometric)  Flexion WNL  Extension WNL  Lateral Flexion WNL WNL  Rotation WNL WNL      Shoulder   Flexion 4 2*  Extension    Abduction 4* 2*  External rotation 3 2*  Internal rotation 5 5  Horizontal abduction    Horizontal adduction    Lower Trapezius    Rhomboids        Elbow  Flexion 5 5  Extension 5 5  Pronation 5 5  Supination 5 5      Wrist  Flexion 5 5  Extension 5 5  Radial deviation 5 5  Ulnar deviation 5 5      MCP  Flexion 5 5  Extension 5 5  Abduction 5 5  Adduction 5 5  (* = pain; Blank rows = not tested)  Sensation Deferred  Reflexes Deferred  Palpation General tenderness to palpation along anterior, lateral, and posterior shoulder  Passive Accessory Intervertebral Motion Deferred  Accessory Motions/Glides Deferred  Muscle Length  Testing Deferred  SPECIAL TESTS Rotator Cuff  Drop Arm Test: Negative Painful Arc (Pain from 60 to 120 degrees scaption): Positive Infraspinatus Muscle Test: Positive  Subacromial Impingement Hawkins-Kennedy: Not examined Neer (Block scapula, PROM flexion): Positive Painful Arc (Pain from 60 to 120 degrees scaption): Positive Empty Can: Not examined External Rotation Resistance: Positive Horizontal Adduction: Not examined Scapular Assist: Not examined  Labral Tear Biceps Load II (120 elevation, full ER, 90 elbow flexion, full supination, resisted elbow flexion): Not examined Crank (160 scaption, axial load with IR/ER): Not examined O'Briens/Active Compression Test (90 shoulder flexion, 10 adduction, full IR): Not examined  Bicep Tendon Pathology Speed (shoulder flexion to 90, external rotation, full elbow extension, and forearm supination with resistance: Not examined Yergason's (resisted shoulder ER and supination/biceps tendon pathology): Not examined  Shoulder Instability Sulcus Sign: Negative Anterior Apprehension: Negative  Beighton scale Deferred   TODAY'S TREATMENT   SUBJECTIVE: Pt reports that she is doing well today. Mild soreness from HEP but states that she feels like her strength is improving. No specific questions or concerns.    PAIN: "soreness" but no pain;   Ther-ex  Seated pulleys for L shoulder flexion and abduction x multiple bouts; Supine L shoulder isometrics for flexion, extension, abduction, adduction, ER, and IR 3-5s hold x 10 each direction; Attempted supine L shoulder AROM flexion but pt unable to complete secondary to pain;  Manually resisted L elbow flexion and extension x 10 each; Manually resisted L forearm pronation and supination x 10 each;   Manual Therapy   L shoulder PROM for flexion, scaption, ER, and IR x multiple bouts each direction; L GH AP grade I-II mobilizations at neutral, 20s/bout x 2 bouts; L GH inferior grade I-II  mobilizations at 90 abduction, 20s/bout x 2 bouts;   PATIENT EDUCATION:  Education details: Plan of care Person educated: Patient Education method: Explanation Education comprehension: verbalized understanding   HOME EXERCISE PROGRAM:  Access Code: [email or web address] URL: [email or web address] Date: 07/01/2023 Prepared by: Crawford Dock  Exercises - Seated Scapular Retraction  - 1 x daily - 7 x weekly - 2 sets - 10 reps - 3s hold - Standing Isometric Shoulder Flexion with Doorway - Arm Bent (Mirrored)  - 1 x daily - 7 x weekly - 2 sets - 10 reps - 3s hold - Standing Isometric Shoulder Abduction with Doorway - Arm Bent  - 1 x  daily - 7 x weekly - 2 sets - 10 reps - 3s hold - Standing Isometric Shoulder Extension with Doorway - Arm Bent (Mirrored)  - 1 x daily - 7 x weekly - 2 sets - 10 reps - 3s hold   ASSESSMENT:  CLINICAL IMPRESSION: Progressed PROM and isometric L shoulder strengthening exercises during session today. Attempted to progress to supine AROM however limited by pain. No HEP modifications at this time but pt encouraged to continue as instructed and follow-up as scheduled. She will benefit from PT services to address deficits in strength, balance, and mobility in order to return to full function at home and decrease his risk for falls.    OBJECTIVE IMPAIRMENTS: decreased ROM, decreased strength, impaired UE functional use, postural dysfunction, and pain.   ACTIVITY LIMITATIONS: carrying, lifting, and reach over head  PARTICIPATION LIMITATIONS:  household chores  PERSONAL FACTORS: Age, Past/current experiences, and 3+ comorbidities: osteopenia, DMII, L shoulder OA  are also affecting patient's functional outcome.   REHAB POTENTIAL: Fair    CLINICAL DECISION MAKING: Stable/uncomplicated  EVALUATION COMPLEXITY: Low   GOALS: Goals reviewed with patient? Yes  SHORT TERM GOALS: Target date: 08/05/2023  Pt will be independent with HEP to improve strength and  decrease shoulder pain to improve pain-free function at home and with self-care Baseline:  Goal status: INITIAL   LONG TERM GOALS: Target date: 09/16/2023  Pt will decrease quick DASH score by at least 8% in order to demonstrate clinically significant reduction in disability related to shoulder pain. Baseline: 36.4% Goal status: INITIAL  2.  Pt will decrease worst shoulder pain by at least 3 points on the NPRS in order to demonstrate clinically significant reduction in shoulder pain. Baseline: 3/10; Goal status: INITIAL  3. Pt will increase strength of pain-free L shoulder flexion, abduction and ER to at least 3+/5 MMT grade in order to demonstrate improvement in strength and function         Baseline: 2/5 for flexion, abduction, and ER with pain; Goal status: INITIAL   PLAN: PT FREQUENCY: 2x/week  PT DURATION: 12 weeks  PLANNED INTERVENTIONS: Therapeutic exercises, Therapeutic activity, Neuromuscular re-education, Balance training, Gait training, Patient/Family education, Self Care, Joint mobilization, Joint manipulation, Vestibular training, Canalith repositioning, Orthotic/Fit training, DME instructions, Dry Needling, Electrical stimulation, Spinal manipulation, Spinal mobilization, Cryotherapy, Moist heat, Taping, Traction, Ultrasound, Ionotophoresis 4mg /ml Dexamethasone, Manual therapy, and Re-evaluation.  PLAN FOR NEXT SESSION: Progress L shoulder ROM and strengthening, modify/review HEP as necessary;   Morey Andonian D Samaia Iwata PT, DPT, GCS  Madine Sarr, PT 07/08/2023, 9:50 PM

## 2023-07-10 ENCOUNTER — Encounter

## 2023-07-15 ENCOUNTER — Encounter

## 2023-07-15 DIAGNOSIS — I272 Pulmonary hypertension, unspecified: Secondary | ICD-10-CM | POA: Diagnosis not present

## 2023-07-15 DIAGNOSIS — J449 Chronic obstructive pulmonary disease, unspecified: Secondary | ICD-10-CM | POA: Diagnosis not present

## 2023-07-17 ENCOUNTER — Encounter

## 2023-07-17 ENCOUNTER — Ambulatory Visit: Attending: Hand Surgery

## 2023-07-17 DIAGNOSIS — M25512 Pain in left shoulder: Secondary | ICD-10-CM | POA: Diagnosis not present

## 2023-07-17 DIAGNOSIS — M6281 Muscle weakness (generalized): Secondary | ICD-10-CM | POA: Insufficient documentation

## 2023-07-17 NOTE — Therapy (Signed)
 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, neck, and leg   Diabetes mellitus without complication (HCC)    GERD (gastroesophageal reflux disease)    Hyperlipidemia    Hypertension    Peripheral neuropathy    Peripheral vascular disease (HCC)    Past Surgical History:  Procedure Laterality Date   APPENDECTOMY     BREAST BIOPSY Left    neg   PERIPHERAL VASCULAR CATHETERIZATION Right 11/07/2015   Procedure: Lower Extremity Angiography;  Surgeon: Jackquelyn Mass, MD;  Location: [facility];  Service: Cardiovascular;  Laterality: Right;   TONSILLECTOMY     Patient Active Problem List   Diagnosis Date Noted   Pulmonary hypertension (HCC) 11/10/2019   Type 2 diabetes mellitus (HCC) 05/21/2017   Family history of breast cancer in sister 05/08/2017   Impingement syndrome, shoulder, right 11/26/2016   Primary osteoarthritis of right knee 11/26/2016   Right knee pain 11/26/2016   Acute pain of right shoulder 10/29/2016   Tear of biceps tendon 10/29/2016   DJD (degenerative joint disease) 10/14/2016   Atherosclerosis of native arteries of extremity with intermittent claudication (HCC) 12/28/2015   Pain in limb 12/28/2015   Neuropathy of foot, right 12/28/2015   Essential hypertension 12/28/2015   Hyperlipidemia 12/28/2015   Peripheral arterial disease (HCC) 11/20/2015   Hypomagnesemia  09/29/2014   Barrett esophagus 09/23/2013   Carotid atherosclerosis 09/23/2013   CRI (chronic renal insufficiency), stage 3 (moderate) (HCC) 09/23/2013   High risk medication use 09/23/2013   Gastro-esophageal reflux disease with esophagitis 08/30/2013   Hyperlipidemia due to type 2 diabetes mellitus (HCC) 08/30/2013   Osteopenia 08/30/2013   Vasomotor rhinitis 08/30/2013   Vitamin D deficiency 08/30/2013   PCP: Naaman Au NP  REFERRING PROVIDER: Rockney Cid MD  REFERRING DIAG: S46.012A (ICD-10-CM) - Strain of muscle(s) and tendon(s) of the rotator cuff of left shoulder, initial encounter M19.012 (ICD-10-CM) - Primary osteoarthritis, left shoulder  RATIONALE FOR EVALUATION AND TREATMENT: Rehabilitation  THERAPY DIAG: Acute pain of left shoulder  Muscle weakness (generalized)  ONSET DATE: 05/26/23  FOLLOW-UP APPT SCHEDULED WITH REFERRING PROVIDER: No   FROM INITIAL EVALUATION SUBJECTIVE:  SUBJECTIVE STATEMENT:  L shoulder weakness/pain;   PERTINENT HISTORY:  Pt experienced acute severe shoulder pain following an incident 05/26/23 where she twisted and pushed awkwardly while moving a cat off her lap. She reports immediate severe pain. She went to the urgent care where radiographs did not reveal any fractures but indicated severe arthritis in the [facility] joint and humeral head elevation suggesting damage to the rotator cuff tendons, as the humerus appears to be positioned high riding. She was eventually referred to orthopedics and she was given a L shoulder injection on 05/28/23 which has helped considerably with her pain. She was referred to PT for strengthening secondary to suspected RTC injury. At this time she only reports generalized soreness in the shoulder but ongoing chronic weakness especially  when lifting her arm overhead. She reports difficulty washing and curling her hair.   3/19/202: 4-view (AP, grashey, Y-view, axillary) x-ray left shoulder ordered and personally reviewed in clinic today. They reveal no acute fractures or dislocations. Mild glenohumeral joint space narrowing and slight decrease in acromiohumeral interval and acromioclavicular joint spaces with advanced arthrosis. Soft tissue unremarkable.  PAIN:  Pain Intensity: Present: 0/10, Best: 0/10, Worst: 3/10 Pain location: Superior and lateral aspect of L shoulder Pain Quality: Soreness Radiating: No  Numbness/Tingling: No Focal Weakness: Yes, worsening L shoulder weakness Aggravating factors: Overhead motion, pushing laterally Relieving factors: L shoulder injection, ice; 24-hour pain behavior: Varies based on activity History of prior shoulder or neck/shoulder injury, pain, surgery, or therapy: Yes, history of L RTC tear 8 years ago, placed in sling. No history of therapy; Falls: Has patient fallen in last 6 months? 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  Recent memory is intact.  Remote memory is intact.  Attention span and concentration are intact.  Expressive speech is intact.  Patient's fund of knowledge is within normal limits for educational level.    Gross Musculoskeletal Assessment Tremor:  None Bulk: Normal Tone: Normal  Gait Deferred  Posture Mild forward head but generally no gross deficits contributing to symptoms;   Cervical Screen AROM: WFL and painless with overpressure in all planes with the exception of some mild pain with L lateral flexion; Spurlings A (ipsilateral lateral flexion/axial compression): R: Negative L: Negative Spurlings B (ipsilateral lateral flexion/contralateral rotation/axial compression): R: Negative L: Negative Repeated movement: Deferred Hoffman Sign (cervical cord compression): R: Not examined L: Not examined ULTT Median: R: Not examined L: Not examined ULTT Ulnar: R: Not examined L: Not examined ULTT Radial: R: Not examined L: Not examined  AROM AROM (Normal range in degrees) AROM   Right Left  Shoulder    Flexion 153 134  Extension    Abduction 116 135  External Rotation 70 70  Internal Rotation 57 50  Hands Behind Head T3 C6  Hands Behind Back T10 waistline      Elbow    Flexion WNL WNL  Extension WNL WNL  Pronation WNL WNL  Supination WNL WNL  (* =  pain; Blank rows = not tested)  UE MMT: MMT (out of 5) Right Left   Cervical (isometric)  Flexion WNL  Extension WNL  Lateral Flexion WNL WNL  Rotation WNL WNL      Shoulder   Flexion 4 2*  Extension    Abduction 4* 2*  External rotation 3 2*  Internal rotation 5 5  Horizontal abduction    Horizontal adduction    Lower Trapezius    Rhomboids        Elbow  Flexion 5 5  Extension 5 5  Pronation 5 5  Supination 5 5      Wrist  Flexion 5 5  Extension 5 5  Radial deviation 5 5  Ulnar deviation 5 5      MCP  Flexion 5 5  Extension 5 5  Abduction 5 5  Adduction 5 5  (* = pain; Blank rows = not tested)  Sensation Deferred  Reflexes Deferred  Palpation General tenderness to palpation along anterior, lateral, and posterior shoulder  Passive Accessory Intervertebral Motion Deferred  Accessory Motions/Glides Deferred  Muscle Length  Testing Deferred  SPECIAL TESTS Rotator Cuff  Drop Arm Test: Negative Painful Arc (Pain from 60 to 120 degrees scaption): Positive Infraspinatus Muscle Test: Positive  Subacromial Impingement Hawkins-Kennedy: Not examined Neer (Block scapula, PROM flexion): Positive Painful Arc (Pain from 60 to 120 degrees scaption): Positive Empty Can: Not examined External Rotation Resistance: Positive Horizontal Adduction: Not examined Scapular Assist: Not examined  Labral Tear Biceps Load II (120 elevation, full ER, 90 elbow flexion, full supination, resisted elbow flexion): Not examined Crank (160 scaption, axial load with IR/ER): Not examined O'Briens/Active Compression Test (90 shoulder flexion, 10 adduction, full IR): Not examined  Bicep Tendon Pathology Speed (shoulder flexion to 90, external rotation, full elbow extension, and forearm supination with resistance: Not examined Yergason's (resisted shoulder ER and supination/biceps tendon pathology): Not examined  Shoulder Instability Sulcus Sign: Negative Anterior Apprehension: Negative  Beighton scale Deferred   TODAY'S TREATMENT   SUBJECTIVE: Pt reports that she is doing well today. Mild soreness upon arrival but no pain. She notes ongoing improvement in her L shoulder strength with overhead motions. No specific questions or concerns.    PAIN: "soreness" but no pain;   Ther-ex  Seated pulleys for L shoulder flexion and abduction x multiple bouts; Supine L shoulder AROM bent elbow flexion x 10; Supine L shoulder AROM straight elbow flexion x 10; Supine isometric L shoulder extension x 10; Supine L shoulder abduction with gentle manual resistance x 10; Supine L shoulder manually resisted ER from belly to forearm vertical x 10; Supine L shoulder manually resisted IR from vertical to belly x 10; Supine L serratus punch at 90 flexion x 10; Supine L shoulder rhythmic stabilization 15 90 flexion x 30s; Manually resisted L  forearm pronation and supination x 10 each;   Manual Therapy   L shoulder PROM for flexion, scaption, ER, and IR x multiple bouts each direction; L GH AP grade I-II mobilizations at neutral, 20s/bout x 2 bouts; L GH inferior grade I-II mobilizations at 90 abduction, 20s/bout x 2 bouts;   PATIENT EDUCATION:  Education details: Plan of care Person educated: Patient Education method: Explanation Education comprehension: verbalized understanding   HOME EXERCISE PROGRAM:  Access Code: [email or web address] URL: [email or web address] Date: 07/01/2023 Prepared by: Crawford Dock  Exercises - Seated Scapular Retraction  - 1 x daily - 7 x weekly - 2 sets - 10 reps - 3s hold - Standing  Isometric Shoulder Flexion with Doorway - Arm Bent (Mirrored)  - 1 x daily - 7 x weekly - 2 sets - 10 reps - 3s hold - Standing Isometric Shoulder Abduction with Doorway - Arm Bent  - 1 x daily - 7 x weekly - 2 sets - 10 reps - 3s hold - Standing Isometric Shoulder Extension with Doorway - Arm Bent (Mirrored)  - 1 x daily - 7 x weekly - 2 sets - 10 reps - 3s hold   ASSESSMENT:  CLINICAL IMPRESSION: Progressed PROM and L shoulder strengthening exercises during session today. She is able to progress to supine AROM against gravity. No HEP modifications at this time but pt encouraged to continue as instructed and follow-up as scheduled. She will benefit from PT services to address deficits in strength, balance, and mobility in order to return to full function at home and decrease his risk for falls.    OBJECTIVE IMPAIRMENTS: decreased ROM, decreased strength, impaired UE functional use, postural dysfunction, and pain.   ACTIVITY LIMITATIONS: carrying, lifting, and reach over head  PARTICIPATION LIMITATIONS: household chores  PERSONAL FACTORS: Age, Past/current experiences, and 3+ comorbidities: osteopenia, DMII, L shoulder OA are also affecting patient's functional outcome.   REHAB POTENTIAL: Fair     CLINICAL DECISION MAKING: Stable/uncomplicated  EVALUATION COMPLEXITY: Low   GOALS: Goals reviewed with patient? Yes  SHORT TERM GOALS: Target date: 08/05/2023  Pt will be independent with HEP to improve strength and decrease shoulder pain to improve pain-free function at home and with self-care Baseline:  Goal status: INITIAL   LONG TERM GOALS: Target date: 09/16/2023  Pt will decrease quick DASH score by at least 8% in order to demonstrate clinically significant reduction in disability related to shoulder pain. Baseline: 36.4% Goal status: INITIAL  2.  Pt will decrease worst shoulder pain by at least 3 points on the NPRS in order to demonstrate clinically significant reduction in shoulder pain. Baseline: 3/10; Goal status: INITIAL  3. Pt will increase strength of pain-free L shoulder flexion, abduction and ER to at least 3+/5 MMT grade in order to demonstrate improvement in strength and function         Baseline: 2/5 for flexion, abduction, and ER with pain; Goal status: INITIAL   PLAN: PT FREQUENCY: 2x/week  PT DURATION: 12 weeks  PLANNED INTERVENTIONS: Therapeutic exercises, Therapeutic activity, Neuromuscular re-education, Balance training, Gait training, Patient/Family education, Self Care, Joint mobilization, Joint manipulation, Vestibular training, Canalith repositioning, Orthotic/Fit training, DME instructions, Dry Needling, Electrical stimulation, Spinal manipulation, Spinal mobilization, Cryotherapy, Moist heat, Taping, Traction, Ultrasound, Ionotophoresis 4mg /ml Dexamethasone , Manual therapy, and Re-evaluation.  PLAN FOR NEXT SESSION: Progress L shoulder ROM and strengthening, modify/review HEP as necessary;   Sherill Ding Milind Raether PT, DPT, GCS  Dariana Garbett, PT 07/19/2023, 10:28 AM

## 2023-07-21 NOTE — Therapy (Signed)
 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, neck, and leg   Diabetes mellitus without complication (HCC)    GERD (gastroesophageal reflux disease)    Hyperlipidemia    Hypertension    Peripheral neuropathy    Peripheral vascular disease (HCC)    Past Surgical History:  Procedure Laterality Date   APPENDECTOMY     BREAST BIOPSY Left    neg   PERIPHERAL VASCULAR CATHETERIZATION Right 11/07/2015   Procedure: Lower Extremity Angiography;  Surgeon: Jackquelyn Mass, MD;  Location: [facility];  Service: Cardiovascular;  Laterality: Right;   TONSILLECTOMY     Patient Active Problem List   Diagnosis Date Noted   Pulmonary hypertension (HCC) 11/10/2019   Type 2 diabetes mellitus (HCC) 05/21/2017   Family history of breast cancer in sister 05/08/2017   Impingement syndrome, shoulder, right 11/26/2016   Primary osteoarthritis of right knee 11/26/2016   Right knee pain 11/26/2016   Acute pain of right shoulder 10/29/2016   Tear of biceps tendon 10/29/2016   DJD (degenerative joint disease) 10/14/2016   Atherosclerosis of native arteries of extremity with intermittent claudication (HCC) 12/28/2015   Pain in limb 12/28/2015   Neuropathy of foot, right 12/28/2015   Essential hypertension 12/28/2015   Hyperlipidemia 12/28/2015   Peripheral arterial disease (HCC) 11/20/2015   Hypomagnesemia  09/29/2014   Barrett esophagus 09/23/2013   Carotid atherosclerosis 09/23/2013   CRI (chronic renal insufficiency), stage 3 (moderate) (HCC) 09/23/2013   High risk medication use 09/23/2013   Gastro-esophageal reflux disease with esophagitis 08/30/2013   Hyperlipidemia due to type 2 diabetes mellitus (HCC) 08/30/2013   Osteopenia 08/30/2013   Vasomotor rhinitis 08/30/2013   Vitamin D deficiency 08/30/2013   PCP: Naaman Au NP  REFERRING PROVIDER: Rockney Cid MD  REFERRING DIAG: S46.012A (ICD-10-CM) - Strain of muscle(s) and tendon(s) of the rotator cuff of left shoulder, initial encounter M19.012 (ICD-10-CM) - Primary osteoarthritis, left shoulder  RATIONALE FOR EVALUATION AND TREATMENT: Rehabilitation  THERAPY DIAG: Acute pain of left shoulder  Muscle weakness (generalized)  ONSET DATE: 05/26/23  FOLLOW-UP APPT SCHEDULED WITH REFERRING PROVIDER: No   FROM INITIAL EVALUATION SUBJECTIVE:  SUBJECTIVE STATEMENT:  L shoulder weakness/pain;   PERTINENT HISTORY:  Pt experienced acute severe shoulder pain following an incident 05/26/23 where she twisted and pushed awkwardly while moving a cat off her lap. She reports immediate severe pain. She went to the urgent care where radiographs did not reveal any fractures but indicated severe arthritis in the [facility] joint and humeral head elevation suggesting damage to the rotator cuff tendons, as the humerus appears to be positioned high riding. She was eventually referred to orthopedics and she was given a L shoulder injection on 05/28/23 which has helped considerably with her pain. She was referred to PT for strengthening secondary to suspected RTC injury. At this time she only reports generalized soreness in the shoulder but ongoing chronic weakness especially  when lifting her arm overhead. She reports difficulty washing and curling her hair.   3/19/202: 4-view (AP, grashey, Y-view, axillary) x-ray left shoulder ordered and personally reviewed in clinic today. They reveal no acute fractures or dislocations. Mild glenohumeral joint space narrowing and slight decrease in acromiohumeral interval and acromioclavicular joint spaces with advanced arthrosis. Soft tissue unremarkable.  PAIN:  Pain Intensity: Present: 0/10, Best: 0/10, Worst: 3/10 Pain location: Superior and lateral aspect of L shoulder Pain Quality: Soreness Radiating: No  Numbness/Tingling: No Focal Weakness: Yes, worsening L shoulder weakness Aggravating factors: Overhead motion, pushing laterally Relieving factors: L shoulder injection, ice; 24-hour pain behavior: Varies based on activity History of prior shoulder or neck/shoulder injury, pain, surgery, or therapy: Yes, history of L RTC tear 8 years ago, placed in sling. No history of therapy; Falls: Has patient fallen in last 6 months? 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  Recent memory is intact.  Remote memory is intact.  Attention span and concentration are intact.  Expressive speech is intact.  Patient's fund of knowledge is within normal limits for educational level.    Gross Musculoskeletal Assessment Tremor:  None Bulk: Normal Tone: Normal  Gait Deferred  Posture Mild forward head but generally no gross deficits contributing to symptoms;   Cervical Screen AROM: WFL and painless with overpressure in all planes with the exception of some mild pain with L lateral flexion; Spurlings A (ipsilateral lateral flexion/axial compression): R: Negative L: Negative Spurlings B (ipsilateral lateral flexion/contralateral rotation/axial compression): R: Negative L: Negative Repeated movement: Deferred Hoffman Sign (cervical cord compression): R: Not examined L: Not examined ULTT Median: R: Not examined L: Not examined ULTT Ulnar: R: Not examined L: Not examined ULTT Radial: R: Not examined L: Not examined  AROM AROM (Normal range in degrees) AROM   Right Left  Shoulder    Flexion 153 134  Extension    Abduction 116 135  External Rotation 70 70  Internal Rotation 57 50  Hands Behind Head T3 C6  Hands Behind Back T10 waistline      Elbow    Flexion WNL WNL  Extension WNL WNL  Pronation WNL WNL  Supination WNL WNL  (* =  pain; Blank rows = not tested)  UE MMT: MMT (out of 5) Right Left   Cervical (isometric)  Flexion WNL  Extension WNL  Lateral Flexion WNL WNL  Rotation WNL WNL      Shoulder   Flexion 4 2*  Extension    Abduction 4* 2*  External rotation 3 2*  Internal rotation 5 5  Horizontal abduction    Horizontal adduction    Lower Trapezius    Rhomboids        Elbow  Flexion 5 5  Extension 5 5  Pronation 5 5  Supination 5 5      Wrist  Flexion 5 5  Extension 5 5  Radial deviation 5 5  Ulnar deviation 5 5      MCP  Flexion 5 5  Extension 5 5  Abduction 5 5  Adduction 5 5  (* = pain; Blank rows = not tested)  Sensation Deferred  Reflexes Deferred  Palpation General tenderness to palpation along anterior, lateral, and posterior shoulder  Passive Accessory Intervertebral Motion Deferred  Accessory Motions/Glides Deferred  Muscle Length  Testing Deferred  SPECIAL TESTS Rotator Cuff  Drop Arm Test: Negative Painful Arc (Pain from 60 to 120 degrees scaption): Positive Infraspinatus Muscle Test: Positive  Subacromial Impingement Hawkins-Kennedy: Not examined Neer (Block scapula, PROM flexion): Positive Painful Arc (Pain from 60 to 120 degrees scaption): Positive Empty Can: Not examined External Rotation Resistance: Positive Horizontal Adduction: Not examined Scapular Assist: Not examined  Labral Tear Biceps Load II (120 elevation, full ER, 90 elbow flexion, full supination, resisted elbow flexion): Not examined Crank (160 scaption, axial load with IR/ER): Not examined O'Briens/Active Compression Test (90 shoulder flexion, 10 adduction, full IR): Not examined  Bicep Tendon Pathology Speed (shoulder flexion to 90, external rotation, full elbow extension, and forearm supination with resistance: Not examined Yergason's (resisted shoulder ER and supination/biceps tendon pathology): Not examined  Shoulder Instability Sulcus Sign: Negative Anterior Apprehension: Negative  Beighton scale Deferred   TODAY'S TREATMENT    SUBJECTIVE: Pt reports that she is doing well today. Mild soreness upon arrival but no pain. She notes ongoing improvement in her L shoulder strength especially with reaching overhead. No specific questions or concerns.    PAIN: "soreness" but no pain;   Ther-ex  Seated pulleys for L shoulder flexion and abduction x multiple bouts; Supine L shoulder AROM straight elbow flexion x 10; Supine isometric L shoulder extension x 10; Supine isometric L shoulder adduction x 10; Supine L shoulder manually resisted ER from belly to forearm vertical x 10; Supine L shoulder manually resisted IR from vertical to belly x 10; Supine L serratus punch at 90 flexion x 10; Supine manually resisted L forearm pronation and supination x 10 each; R sidelying L shoulder abduction with gentle manual resistance x 10; R  sidelying L shoulder ER against gravity x 10; Nautilus lat pull downs 30# with handgrips 2 x 10; Nautilus rows 30# with handgrips 2 x 10;   Manual Therapy   L shoulder PROM for flexion, scaption, ER, and IR x multiple bouts each direction; L GH AP grade I-II mobilizations at neutral, 20s/bout x 2 bouts; L GH inferior grade I-II mobilizations at 90 abduction, 20s/bout x 2 bouts;   PATIENT EDUCATION:  Education details: Plan of care Person educated: Patient Education method: Explanation Education comprehension: verbalized understanding   HOME EXERCISE PROGRAM:  Access Code: [email or web address] URL: [email or web address] Date: 07/01/2023 Prepared by: Crawford Dock  Exercises - Seated Scapular Retraction  -  1 x daily - 7 x weekly - 2 sets - 10 reps - 3s hold - Standing Isometric Shoulder Flexion with Doorway - Arm Bent (Mirrored)  - 1 x daily - 7 x weekly - 2 sets - 10 reps - 3s hold - Standing Isometric Shoulder Abduction with Doorway - Arm Bent  - 1 x daily - 7 x weekly - 2 sets - 10 reps - 3s hold - Standing Isometric Shoulder Extension with Doorway - Arm Bent (Mirrored)  - 1 x daily - 7 x weekly - 2 sets - 10 reps - 3s hold   ASSESSMENT:  CLINICAL IMPRESSION: Progressed PROM and L shoulder strengthening exercises during session today. She is continue supine AROM against gravity with less pain today. Progressed to resisted exercises on the Nautilus machine today. No HEP modifications at this time but pt encouraged to continue as instructed and follow-up as scheduled. She will benefit from PT services to address deficits in strength, balance, and mobility in order to return to full function at home and decrease his risk for falls.    OBJECTIVE IMPAIRMENTS: decreased ROM, decreased strength, impaired UE functional use, postural dysfunction, and pain.   ACTIVITY LIMITATIONS: carrying, lifting, and reach over head  PARTICIPATION LIMITATIONS: household chores  PERSONAL FACTORS:  Age, Past/current experiences, and 3+ comorbidities: osteopenia, DMII, L shoulder OA are also affecting patient's functional outcome.   REHAB POTENTIAL: Fair    CLINICAL DECISION MAKING: Stable/uncomplicated  EVALUATION COMPLEXITY: Low   GOALS: Goals reviewed with patient? Yes  SHORT TERM GOALS: Target date: 08/05/2023  Pt will be independent with HEP to improve strength and decrease shoulder pain to improve pain-free function at home and with self-care Baseline:  Goal status: INITIAL   LONG TERM GOALS: Target date: 09/16/2023  Pt will decrease quick DASH score by at least 8% in order to demonstrate clinically significant reduction in disability related to shoulder pain. Baseline: 36.4% Goal status: INITIAL  2.  Pt will decrease worst shoulder pain by at least 3 points on the NPRS in order to demonstrate clinically significant reduction in shoulder pain. Baseline: 3/10; Goal status: INITIAL  3. Pt will increase strength of pain-free L shoulder flexion, abduction and ER to at least 3+/5 MMT grade in order to demonstrate improvement in strength and function         Baseline: 2/5 for flexion, abduction, and ER with pain; Goal status: INITIAL   PLAN: PT FREQUENCY: 2x/week  PT DURATION: 12 weeks  PLANNED INTERVENTIONS: Therapeutic exercises, Therapeutic activity, Neuromuscular re-education, Balance training, Gait training, Patient/Family education, Self Care, Joint mobilization, Joint manipulation, Vestibular training, Canalith repositioning, Orthotic/Fit training, DME instructions, Dry Needling, Electrical stimulation, Spinal manipulation, Spinal mobilization, Cryotherapy, Moist heat, Taping, Traction, Ultrasound, Ionotophoresis 4mg /ml Dexamethasone , Manual therapy, and Re-evaluation.  PLAN FOR NEXT SESSION: Progress L shoulder AROM and strengthening, modify/review HEP as necessary;   Tyrihanna Wingert D Stetson Pelaez PT, DPT, GCS  Johnie Stadel, PT 07/23/2023, 8:28 AM

## 2023-07-22 ENCOUNTER — Ambulatory Visit

## 2023-07-22 DIAGNOSIS — M6281 Muscle weakness (generalized): Secondary | ICD-10-CM

## 2023-07-22 DIAGNOSIS — M25512 Pain in left shoulder: Secondary | ICD-10-CM | POA: Diagnosis not present

## 2023-07-24 ENCOUNTER — Encounter

## 2023-07-24 NOTE — Therapy (Signed)
 OUTPATIENT PHYSICAL THERAPY SHOULDER TREATMENT  Patient Name: Katie Cunningham MRN: [id number] DOB:December 26, 1931, 89 y.o., female Today's Date: 07/29/2023  END OF SESSION:  PT End of Session - 07/29/23 1407     Visit Number 6    Number of Visits 17    Date for PT Re-Evaluation 08/19/23    Authorization Type eval: 06/24/23    PT Start Time 1400    PT Stop Time 1445    PT Time Calculation (min) 45 min    Activity Tolerance Patient tolerated treatment well    Behavior During Therapy WFL for tasks assessed/performed            Past Medical History:  Diagnosis Date   Arthritis    Cancer (HCC)    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, neck, and leg   Diabetes mellitus without complication (HCC)    GERD (gastroesophageal reflux disease)    Hyperlipidemia    Hypertension    Peripheral neuropathy    Peripheral vascular disease (HCC)    Past Surgical History:  Procedure Laterality Date   APPENDECTOMY     BREAST BIOPSY Left    neg   PERIPHERAL VASCULAR CATHETERIZATION Right 11/07/2015   Procedure: Lower Extremity Angiography;  Surgeon: Jackquelyn Mass, MD;  Location: [facility];  Service: Cardiovascular;  Laterality: Right;   TONSILLECTOMY     Patient Active Problem List   Diagnosis Date Noted   Pulmonary hypertension (HCC) 11/10/2019   Type 2 diabetes mellitus (HCC) 05/21/2017   Family history of breast cancer in sister 05/08/2017   Impingement syndrome, shoulder, right 11/26/2016   Primary osteoarthritis of right knee 11/26/2016   Right knee pain 11/26/2016   Acute pain of right shoulder 10/29/2016   Tear of biceps tendon 10/29/2016   DJD (degenerative joint disease) 10/14/2016   Atherosclerosis of native arteries of extremity with intermittent claudication (HCC) 12/28/2015   Pain in limb 12/28/2015   Neuropathy of foot, right 12/28/2015   Essential hypertension 12/28/2015   Hyperlipidemia 12/28/2015   Peripheral arterial disease (HCC) 11/20/2015   Hypomagnesemia  09/29/2014   Barrett esophagus 09/23/2013   Carotid atherosclerosis 09/23/2013   CRI (chronic renal insufficiency), stage 3 (moderate) (HCC) 09/23/2013   High risk medication use 09/23/2013   Gastro-esophageal reflux disease with esophagitis 08/30/2013   Hyperlipidemia due to type 2 diabetes mellitus (HCC) 08/30/2013   Osteopenia 08/30/2013   Vasomotor rhinitis 08/30/2013   Vitamin D deficiency 08/30/2013   PCP: Naaman Au NP  REFERRING PROVIDER: Rockney Cid MD  REFERRING DIAG: S46.012A (ICD-10-CM) - Strain of muscle(s) and tendon(s) of the rotator cuff of left shoulder, initial encounter M19.012 (ICD-10-CM) - Primary osteoarthritis, left shoulder  RATIONALE FOR EVALUATION AND TREATMENT: Rehabilitation  THERAPY DIAG: Acute pain of left shoulder  Muscle weakness (generalized)  ONSET DATE: 05/26/23  FOLLOW-UP APPT SCHEDULED WITH REFERRING PROVIDER: No   FROM INITIAL EVALUATION SUBJECTIVE:  SUBJECTIVE STATEMENT:  L shoulder weakness/pain;   PERTINENT HISTORY:  Pt experienced acute severe shoulder pain following an incident 05/26/23 where she twisted and pushed awkwardly while moving a cat off her lap. She reports immediate severe pain. She went to the urgent care where radiographs did not reveal any fractures but indicated severe arthritis in the [facility] joint and humeral head elevation suggesting damage to the rotator cuff tendons, as the humerus appears to be positioned high riding. She was eventually referred to orthopedics and she was given a L shoulder injection on 05/28/23 which has helped considerably with her pain. She was referred to PT for strengthening secondary to suspected RTC injury. At this time she only reports generalized soreness in the shoulder but ongoing chronic weakness especially  when lifting her arm overhead. She reports difficulty washing and curling her hair.   3/19/202: 4-view (AP, grashey, Y-view, axillary) x-ray left shoulder ordered and personally reviewed in clinic today. They reveal no acute fractures or dislocations. Mild glenohumeral joint space narrowing and slight decrease in acromiohumeral interval and acromioclavicular joint spaces with advanced arthrosis. Soft tissue unremarkable.  PAIN:  Pain Intensity: Present: 0/10, Best: 0/10, Worst: 3/10 Pain location: Superior and lateral aspect of L shoulder Pain Quality: Soreness Radiating: No  Numbness/Tingling: No Focal Weakness: Yes, worsening L shoulder weakness Aggravating factors: Overhead motion, pushing laterally Relieving factors: L shoulder injection, ice; 24-hour pain behavior: Varies based on activity History of prior shoulder or neck/shoulder injury, pain, surgery, or therapy: Yes, history of L RTC tear 8 years ago, placed in sling. No history of therapy; Falls: Has patient fallen in last 6 months? 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  Recent memory is intact.  Remote memory is intact.  Attention span and concentration are intact.  Expressive speech is intact.  Patient's fund of knowledge is within normal limits for educational level.    Gross Musculoskeletal Assessment Tremor:  None Bulk: Normal Tone: Normal  Gait Deferred  Posture Mild forward head but generally no gross deficits contributing to symptoms;   Cervical Screen AROM: WFL and painless with overpressure in all planes with the exception of some mild pain with L lateral flexion; Spurlings A (ipsilateral lateral flexion/axial compression): R: Negative L: Negative Spurlings B (ipsilateral lateral flexion/contralateral rotation/axial compression): R: Negative L: Negative Repeated movement: Deferred Hoffman Sign (cervical cord compression): R: Not examined L: Not examined ULTT Median: R: Not examined L: Not examined ULTT Ulnar: R: Not examined L: Not examined ULTT Radial: R: Not examined L: Not examined  AROM AROM (Normal range in degrees) AROM   Right Left  Shoulder    Flexion 153 134  Extension    Abduction 116 135  External Rotation 70 70  Internal Rotation 57 50  Hands Behind Head T3 C6  Hands Behind Back T10 waistline      Elbow    Flexion WNL WNL  Extension WNL WNL  Pronation WNL WNL  Supination WNL WNL  (* =  pain; Blank rows = not tested)  UE MMT: MMT (out of 5) Right Left   Cervical (isometric)  Flexion WNL  Extension WNL  Lateral Flexion WNL WNL  Rotation WNL WNL      Shoulder   Flexion 4 2*  Extension    Abduction 4* 2*  External rotation 3 2*  Internal rotation 5 5  Horizontal abduction    Horizontal adduction    Lower Trapezius    Rhomboids        Elbow  Flexion 5 5  Extension 5 5  Pronation 5 5  Supination 5 5      Wrist  Flexion 5 5  Extension 5 5  Radial deviation 5 5  Ulnar deviation 5 5      MCP  Flexion 5 5  Extension 5 5  Abduction 5 5  Adduction 5 5  (* = pain; Blank rows = not tested)  Sensation Deferred  Reflexes Deferred  Palpation General tenderness to palpation along anterior, lateral, and posterior shoulder  Passive Accessory Intervertebral Motion Deferred  Accessory Motions/Glides Deferred  Muscle Length  Testing Deferred  SPECIAL TESTS Rotator Cuff  Drop Arm Test: Negative Painful Arc (Pain from 60 to 120 degrees scaption): Positive Infraspinatus Muscle Test: Positive  Subacromial Impingement Hawkins-Kennedy: Not examined Neer (Block scapula, PROM flexion): Positive Painful Arc (Pain from 60 to 120 degrees scaption): Positive Empty Can: Not examined External Rotation Resistance: Positive Horizontal Adduction: Not examined Scapular Assist: Not examined  Labral Tear Biceps Load II (120 elevation, full ER, 90 elbow flexion, full supination, resisted elbow flexion): Not examined Crank (160 scaption, axial load with IR/ER): Not examined O'Briens/Active Compression Test (90 shoulder flexion, 10 adduction, full IR): Not examined  Bicep Tendon Pathology Speed (shoulder flexion to 90, external rotation, full elbow extension, and forearm supination with resistance: Not examined Yergason's (resisted shoulder ER and supination/biceps tendon pathology): Not examined  Shoulder Instability Sulcus Sign: Negative Anterior Apprehension: Negative  Beighton scale Deferred   TODAY'S TREATMENT    SUBJECTIVE: Pt reports that she is doing well today. No shoulder soreness or pain upon arrival. She notes ongoing improvement in her L shoulder strength especially with reaching overhead. No specific questions or concerns.    PAIN: "soreness" but no pain;   Ther-ex  UBE x 4 minutes (2 minutes forward/2 minutes backward); Seated pulleys for L shoulder flexion and abduction x multiple bouts; Standing canes behind the back for AAROM into shoulder extension and IR; Standing wall ladder for L shoulder flexion x multiple bouts; Supine L shoulder AROM straight elbow flexion 2 x 10; Supine L serratus punch at 90 flexion 2 x 10; Supine L shoulder rhythmic stabilization at 90 flexion 2 x 30s; Supine manually resisted L elbow flexion/extension 2 x 10 each; R sidelying L shoulder abduction 2 x 10; R  sidelying L shoulder ER against gravity 2 x 10; Nautilus lat pull downs 40# with handgrips 2 x 10; Nautilus rows 40# with handgrips 2 x 10;   Not performed: L shoulder PROM for flexion, scaption, ER, and IR x multiple bouts each direction; L GH AP grade I-II mobilizations at neutral, 20s/bout x 2 bouts; L GH inferior grade I-II mobilizations at 90 abduction, 20s/bout x 2 bouts; Supine isometric L shoulder extension x 10; Supine isometric L shoulder adduction x 10; Supine L shoulder manually resisted ER from belly to forearm vertical x 10; Supine L shoulder manually resisted IR from vertical to belly x 10;   PATIENT EDUCATION:  Education details: Pt educated throughout session about proper posture and technique with exercises. Improved exercise technique, movement at target joints, use of target muscles after min to mod verbal, visual, tactile cues.  Person educated: Patient Education method: Explanation Education comprehension: verbalized understanding   HOME EXERCISE PROGRAM:  Access Code: [email or web address] URL: [email or web address] Date: 07/01/2023 Prepared by: Crawford Dock  Exercises - Seated Scapular Retraction  - 1 x daily - 7 x weekly - 2 sets - 10 reps - 3s hold - Standing Isometric Shoulder Flexion with Doorway - Arm Bent (Mirrored)  - 1 x daily - 7 x weekly - 2 sets - 10 reps - 3s hold - Standing Isometric Shoulder Abduction with Doorway - Arm Bent  - 1 x daily - 7 x weekly - 2 sets - 10 reps - 3s hold - Standing Isometric Shoulder Extension with Doorway - Arm Bent (Mirrored)  - 1 x daily - 7 x weekly - 2 sets - 10 reps - 3s hold   ASSESSMENT:  CLINICAL IMPRESSION: Progressed L shoulder strengthening exercises during session today. Attempted AROM shoulder flexion against gravity however she continues to report pain. However she is able to perform supine AROM against gravity without pain today. Repeated resisted exercises on the Nautilus machine today and  increased weight. No HEP modifications at this time but pt encouraged to continue as instructed and follow-up as scheduled. She will benefit from PT services to address deficits in strength, balance, and mobility in order to return to full function at home and decrease his risk for falls.    OBJECTIVE IMPAIRMENTS: decreased ROM, decreased strength, impaired UE functional use, postural dysfunction, and pain.   ACTIVITY LIMITATIONS: carrying, lifting, and reach over head  PARTICIPATION LIMITATIONS: household chores  PERSONAL FACTORS: Age, Past/current experiences, and 3+ comorbidities: osteopenia, DMII, L shoulder OA are also affecting patient's functional outcome.   REHAB POTENTIAL: Fair    CLINICAL DECISION MAKING: Stable/uncomplicated  EVALUATION COMPLEXITY: Low   GOALS: Goals reviewed with patient? Yes  SHORT TERM GOALS: Target date: 08/05/2023  Pt will be independent with HEP to improve strength and decrease shoulder pain to improve pain-free function at home and with self-care Baseline:  Goal status: INITIAL   LONG TERM GOALS: Target date: 09/16/2023  Pt will decrease quick DASH score by at least 8% in order to demonstrate clinically significant reduction in disability related to shoulder pain. Baseline: 36.4% Goal status: INITIAL  2.  Pt will decrease worst shoulder pain by at least 3 points on the NPRS in order to demonstrate clinically significant reduction in shoulder pain. Baseline: 3/10; 07/29/23: 1/10; Goal status: PARTIALLY MET  3. Pt will increase strength of pain-free L shoulder flexion, abduction and ER to at least 3+/5 MMT grade in order to demonstrate improvement in strength and function         Baseline: 2/5 for flexion, abduction, and ER with pain; Goal status: INITIAL   PLAN: PT FREQUENCY: 2x/week  PT DURATION: 12 weeks  PLANNED INTERVENTIONS: Therapeutic exercises, Therapeutic activity, Neuromuscular re-education, Balance training, Gait training,  Patient/Family education, Self Care, Joint mobilization, Joint manipulation, Vestibular training, Canalith repositioning, Orthotic/Fit training, DME instructions, Dry Needling, Electrical stimulation, Spinal manipulation, Spinal mobilization, Cryotherapy, Moist heat, Taping, Traction, Ultrasound, Ionotophoresis 4mg /ml Dexamethasone , Manual therapy, and Re-evaluation.  PLAN FOR NEXT SESSION: Progress L shoulder AROM and strengthening, modify/review HEP as necessary;   Lucresha Dismuke D Shanquita Ronning PT, DPT, GCS  Anthoni Geerts, PT 07/29/2023, 5:09 PM

## 2023-07-29 ENCOUNTER — Encounter (INDEPENDENT_AMBULATORY_CARE_PROVIDER_SITE_OTHER): Payer: Self-pay

## 2023-07-29 ENCOUNTER — Ambulatory Visit

## 2023-07-29 DIAGNOSIS — M25512 Pain in left shoulder: Secondary | ICD-10-CM | POA: Diagnosis not present

## 2023-07-29 DIAGNOSIS — M6281 Muscle weakness (generalized): Secondary | ICD-10-CM | POA: Diagnosis not present

## 2023-07-30 DIAGNOSIS — I10 Essential (primary) hypertension: Secondary | ICD-10-CM | POA: Diagnosis not present

## 2023-07-30 DIAGNOSIS — E1169 Type 2 diabetes mellitus with other specified complication: Secondary | ICD-10-CM | POA: Diagnosis not present

## 2023-07-30 DIAGNOSIS — N183 Chronic kidney disease, stage 3 unspecified: Secondary | ICD-10-CM | POA: Diagnosis not present

## 2023-07-30 DIAGNOSIS — E1142 Type 2 diabetes mellitus with diabetic polyneuropathy: Secondary | ICD-10-CM | POA: Diagnosis not present

## 2023-07-30 DIAGNOSIS — Z09 Encounter for follow-up examination after completed treatment for conditions other than malignant neoplasm: Secondary | ICD-10-CM | POA: Diagnosis not present

## 2023-07-30 DIAGNOSIS — Z1331 Encounter for screening for depression: Secondary | ICD-10-CM | POA: Diagnosis not present

## 2023-07-30 DIAGNOSIS — R5381 Other malaise: Secondary | ICD-10-CM | POA: Diagnosis not present

## 2023-07-30 DIAGNOSIS — R2689 Other abnormalities of gait and mobility: Secondary | ICD-10-CM | POA: Diagnosis not present

## 2023-07-30 DIAGNOSIS — I129 Hypertensive chronic kidney disease with stage 1 through stage 4 chronic kidney disease, or unspecified chronic kidney disease: Secondary | ICD-10-CM | POA: Diagnosis not present

## 2023-07-30 DIAGNOSIS — R531 Weakness: Secondary | ICD-10-CM | POA: Diagnosis not present

## 2023-07-30 DIAGNOSIS — Z86711 Personal history of pulmonary embolism: Secondary | ICD-10-CM | POA: Diagnosis not present

## 2023-07-31 ENCOUNTER — Encounter

## 2023-08-04 NOTE — Therapy (Incomplete)
 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, neck, and leg   Diabetes mellitus without complication (HCC)    GERD (gastroesophageal reflux disease)    Hyperlipidemia    Hypertension    Peripheral neuropathy    Peripheral vascular disease (HCC)    Past Surgical History:  Procedure Laterality Date   APPENDECTOMY     BREAST BIOPSY Left    neg   PERIPHERAL VASCULAR CATHETERIZATION Right 11/07/2015   Procedure: Lower Extremity Angiography;  Surgeon: Jackquelyn Mass, MD;  Location: [facility];  Service: Cardiovascular;  Laterality: Right;   TONSILLECTOMY     Patient Active Problem List   Diagnosis Date Noted   Pulmonary hypertension (HCC) 11/10/2019   Type 2 diabetes mellitus (HCC) 05/21/2017   Family history of breast cancer in sister 05/08/2017   Impingement syndrome, shoulder, right 11/26/2016   Primary osteoarthritis of right knee 11/26/2016   Right knee pain 11/26/2016   Acute pain of right shoulder 10/29/2016   Tear of biceps tendon 10/29/2016   DJD (degenerative joint disease) 10/14/2016   Atherosclerosis of native arteries of extremity with intermittent claudication (HCC) 12/28/2015   Pain in limb 12/28/2015   Neuropathy of foot, right 12/28/2015   Essential hypertension 12/28/2015   Hyperlipidemia 12/28/2015   Peripheral arterial disease (HCC) 11/20/2015   Hypomagnesemia  09/29/2014   Barrett esophagus 09/23/2013   Carotid atherosclerosis 09/23/2013   CRI (chronic renal insufficiency), stage 3 (moderate) (HCC) 09/23/2013   High risk medication use 09/23/2013   Gastro-esophageal reflux disease with esophagitis 08/30/2013   Hyperlipidemia due to type 2 diabetes mellitus (HCC) 08/30/2013   Osteopenia 08/30/2013   Vasomotor rhinitis 08/30/2013   Vitamin D deficiency 08/30/2013   PCP: Naaman Au NP  REFERRING PROVIDER: Rockney Cid MD  REFERRING DIAG: S46.012A (ICD-10-CM) - Strain of muscle(s) and tendon(s) of the rotator cuff of left shoulder, initial encounter M19.012 (ICD-10-CM) - Primary osteoarthritis, left shoulder  RATIONALE FOR EVALUATION AND TREATMENT: Rehabilitation  THERAPY DIAG: Acute pain of left shoulder  Muscle weakness (generalized)  ONSET DATE: 05/26/23  FOLLOW-UP APPT SCHEDULED WITH REFERRING PROVIDER: No   FROM INITIAL EVALUATION SUBJECTIVE:  SUBJECTIVE STATEMENT:  L shoulder weakness/pain;   PERTINENT HISTORY:  Pt experienced acute severe shoulder pain following an incident 05/26/23 where she twisted and pushed awkwardly while moving a cat off her lap. She reports immediate severe pain. She went to the urgent care where radiographs did not reveal any fractures but indicated severe arthritis in the [facility] joint and humeral head elevation suggesting damage to the rotator cuff tendons, as the humerus appears to be positioned high riding. She was eventually referred to orthopedics and she was given a L shoulder injection on 05/28/23 which has helped considerably with her pain. She was referred to PT for strengthening secondary to suspected RTC injury. At this time she only reports generalized soreness in the shoulder but ongoing chronic weakness especially  when lifting her arm overhead. She reports difficulty washing and curling her hair.   3/19/202: 4-view (AP, grashey, Y-view, axillary) x-ray left shoulder ordered and personally reviewed in clinic today. They reveal no acute fractures or dislocations. Mild glenohumeral joint space narrowing and slight decrease in acromiohumeral interval and acromioclavicular joint spaces with advanced arthrosis. Soft tissue unremarkable.  PAIN:  Pain Intensity: Present: 0/10, Best: 0/10, Worst: 3/10 Pain location: Superior and lateral aspect of L shoulder Pain Quality: Soreness Radiating: No  Numbness/Tingling: No Focal Weakness: Yes, worsening L shoulder weakness Aggravating factors: Overhead motion, pushing laterally Relieving factors: L shoulder injection, ice; 24-hour pain behavior: Varies based on activity History of prior shoulder or neck/shoulder injury, pain, surgery, or therapy: Yes, history of L RTC tear 8 years ago, placed in sling. No history of therapy; Falls: Has patient fallen in last 6 months? 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  Recent memory is intact.  Remote memory is intact.  Attention span and concentration are intact.  Expressive speech is intact.  Patient's fund of knowledge is within normal limits for educational level.    Gross Musculoskeletal Assessment Tremor:  None Bulk: Normal Tone: Normal  Gait Deferred  Posture Mild forward head but generally no gross deficits contributing to symptoms;   Cervical Screen AROM: WFL and painless with overpressure in all planes with the exception of some mild pain with L lateral flexion; Spurlings A (ipsilateral lateral flexion/axial compression): R: Negative L: Negative Spurlings B (ipsilateral lateral flexion/contralateral rotation/axial compression): R: Negative L: Negative Repeated movement: Deferred Hoffman Sign (cervical cord compression): R: Not examined L: Not examined ULTT Median: R: Not examined L: Not examined ULTT Ulnar: R: Not examined L: Not examined ULTT Radial: R: Not examined L: Not examined  AROM AROM (Normal range in degrees) AROM   Right Left  Shoulder    Flexion 153 134  Extension    Abduction 116 135  External Rotation 70 70  Internal Rotation 57 50  Hands Behind Head T3 C6  Hands Behind Back T10 waistline      Elbow    Flexion WNL WNL  Extension WNL WNL  Pronation WNL WNL  Supination WNL WNL  (* =  pain; Blank rows = not tested)  UE MMT: MMT (out of 5) Right Left   Cervical (isometric)  Flexion WNL  Extension WNL  Lateral Flexion WNL WNL  Rotation WNL WNL      Shoulder   Flexion 4 2*  Extension    Abduction 4* 2*  External rotation 3 2*  Internal rotation 5 5  Horizontal abduction    Horizontal adduction    Lower Trapezius    Rhomboids        Elbow  Flexion 5 5  Extension 5 5  Pronation 5 5  Supination 5 5      Wrist  Flexion 5 5  Extension 5 5  Radial deviation 5 5  Ulnar deviation 5 5      MCP  Flexion 5 5  Extension 5 5  Abduction 5 5  Adduction 5 5  (* = pain; Blank rows = not tested)  Sensation Deferred  Reflexes Deferred  Palpation General tenderness to palpation along anterior, lateral, and posterior shoulder  Passive Accessory Intervertebral Motion Deferred  Accessory Motions/Glides Deferred  Muscle Length  Testing Deferred  SPECIAL TESTS Rotator Cuff  Drop Arm Test: Negative Painful Arc (Pain from 60 to 120 degrees scaption): Positive Infraspinatus Muscle Test: Positive  Subacromial Impingement Hawkins-Kennedy: Not examined Neer (Block scapula, PROM flexion): Positive Painful Arc (Pain from 60 to 120 degrees scaption): Positive Empty Can: Not examined External Rotation Resistance: Positive Horizontal Adduction: Not examined Scapular Assist: Not examined  Labral Tear Biceps Load II (120 elevation, full ER, 90 elbow flexion, full supination, resisted elbow flexion): Not examined Crank (160 scaption, axial load with IR/ER): Not examined O'Briens/Active Compression Test (90 shoulder flexion, 10 adduction, full IR): Not examined  Bicep Tendon Pathology Speed (shoulder flexion to 90, external rotation, full elbow extension, and forearm supination with resistance: Not examined Yergason's (resisted shoulder ER and supination/biceps tendon pathology): Not examined  Shoulder Instability Sulcus Sign: Negative Anterior Apprehension: Negative  Beighton scale Deferred   TODAY'S TREATMENT    SUBJECTIVE: Pt reports that her L shoulder is hurting more with the colder temperatures and the rain. She rates her pain as 7/10 upon arrival. However, she notes ongoing improvement in her L shoulder strength and has been able to perform some more overhead tasks.   PAIN: 7/10 L shoulder pain   Ther-ex  UBE x 3.5 minutes (2 minutes forward/2 minutes backward); Seated pulleys for L shoulder flexion and abduction x multiple bouts; Standing canes behind the back for AAROM into shoulder extension and IR; Attempted standing AAROM canes for shoulder flexion but discontinued secondary to pain; Standing L shoulder AROM flexion to shoulder height x 5, DC secondary to pain; Supine L shoulder AROM flexion with straight elbow 2 x 10; Supine L serratus punch at 90 flexion x 10; Supine manually resisted L  elbow flexion/extension 2 x 10 each; Nautilus lat pull downs 40# with handgrips 2 x 10; Nautilus rows 40# with handgrips 2 x 10;   Manual Therapy  L shoulder PROM for flexion, scaption, ER, and IR x multiple bouts each direction; Attempted L GH AP grade I-II mobilizations at neutral but DC secondary to pain;   Not performed: L GH inferior grade I-II mobilizations at 90 abduction, 20s/bout x 2 bouts; Supine isometric L shoulder extension x 10; Supine isometric L shoulder adduction x 10; Supine L shoulder manually resisted ER from belly to forearm vertical x 10; Supine L shoulder manually resisted IR from vertical to belly x 10; Standing  wall ladder for L shoulder flexion x multiple bouts; R sidelying L shoulder abduction 2 x 10; R sidelying L shoulder ER against gravity 2 x 10;   PATIENT EDUCATION:  Education details: Pt educated throughout session about proper posture and technique with exercises. Improved exercise technique, movement at target joints, use of target muscles after min to mod verbal, visual, tactile cues.  Person educated: Patient Education method: Explanation Education comprehension: verbalized understanding   HOME EXERCISE PROGRAM:  Access Code: [email or web address] URL: [email or web address] Date: 07/01/2023 Prepared by: Crawford Dock  Exercises - Seated Scapular Retraction  - 1 x daily - 7 x weekly - 2 sets - 10 reps - 3s hold - Standing Isometric Shoulder Flexion with Doorway - Arm Bent (Mirrored)  - 1 x daily - 7 x weekly - 2 sets - 10 reps - 3s hold - Standing Isometric Shoulder Abduction with Doorway - Arm Bent  - 1 x daily - 7 x weekly - 2 sets - 10 reps - 3s hold - Standing Isometric Shoulder Extension with Doorway - Arm Bent (Mirrored)  - 1 x daily - 7 x weekly - 2 sets - 10 reps - 3s hold   ASSESSMENT:  CLINICAL IMPRESSION: Progressed L shoulder strengthening exercises during session today. Attempted AROM shoulder flexion against gravity  however she continues to report pain. However she is able to perform supine AROM against gravity without pain again today. Repeated resisted exercises on the Nautilus machine today. No HEP modifications at this time but pt encouraged to continue as instructed and follow-up as scheduled. She will benefit from PT services to address deficits in strength, balance, and mobility in order to return to full function at home and decrease his risk for falls.    OBJECTIVE IMPAIRMENTS: decreased ROM, decreased strength, impaired UE functional use, postural dysfunction, and pain.   ACTIVITY LIMITATIONS: carrying, lifting, and reach over head  PARTICIPATION LIMITATIONS: household chores  PERSONAL FACTORS: Age, Past/current experiences, and 3+ comorbidities: osteopenia, DMII, L shoulder OA are also affecting patient's functional outcome.   REHAB POTENTIAL: Fair    CLINICAL DECISION MAKING: Stable/uncomplicated  EVALUATION COMPLEXITY: Low   GOALS: Goals reviewed with patient? Yes  SHORT TERM GOALS: Target date: 08/05/2023  Pt will be independent with HEP to improve strength and decrease shoulder pain to improve pain-free function at home and with self-care Baseline:  Goal status: INITIAL   LONG TERM GOALS: Target date: 09/16/2023  Pt will decrease quick DASH score by at least 8% in order to demonstrate clinically significant reduction in disability related to shoulder pain. Baseline: 36.4% Goal status: INITIAL  2.  Pt will decrease worst shoulder pain by at least 3 points on the NPRS in order to demonstrate clinically significant reduction in shoulder pain. Baseline: 3/10; 07/29/23: 1/10; Goal status: PARTIALLY MET  3. Pt will increase strength of pain-free L shoulder flexion, abduction and ER to at least 3+/5 MMT grade in order to demonstrate improvement in strength and function         Baseline: 2/5 for flexion, abduction, and ER with pain; Goal status: INITIAL   PLAN: PT FREQUENCY:  2x/week  PT DURATION: 12 weeks  PLANNED INTERVENTIONS: Therapeutic exercises, Therapeutic activity, Neuromuscular re-education, Balance training, Gait training, Patient/Family education, Self Care, Joint mobilization, Joint manipulation, Vestibular training, Canalith repositioning, Orthotic/Fit training, DME instructions, Dry Needling, Electrical stimulation, Spinal manipulation, Spinal mobilization, Cryotherapy, Moist heat, Taping, Traction, Ultrasound, Ionotophoresis 4mg /ml Dexamethasone , Manual therapy, and Re-evaluation.  PLAN FOR NEXT SESSION: Progress L shoulder AROM  and strengthening, modify/review HEP as necessary;   Hanadi Stanly D Grayton Lobo PT, DPT, GCS  Karsin Pesta, PT 08/06/2023, 4:02 PM

## 2023-08-05 ENCOUNTER — Ambulatory Visit

## 2023-08-05 DIAGNOSIS — M6281 Muscle weakness (generalized): Secondary | ICD-10-CM | POA: Diagnosis not present

## 2023-08-05 DIAGNOSIS — M25512 Pain in left shoulder: Secondary | ICD-10-CM | POA: Diagnosis not present

## 2023-08-07 ENCOUNTER — Encounter

## 2023-08-07 DIAGNOSIS — J432 Centrilobular emphysema: Secondary | ICD-10-CM | POA: Diagnosis not present

## 2023-08-07 DIAGNOSIS — S52514A Nondisplaced fracture of right radial styloid process, initial encounter for closed fracture: Secondary | ICD-10-CM | POA: Diagnosis not present

## 2023-08-07 DIAGNOSIS — E1142 Type 2 diabetes mellitus with diabetic polyneuropathy: Secondary | ICD-10-CM | POA: Diagnosis not present

## 2023-08-07 DIAGNOSIS — S52511A Displaced fracture of right radial styloid process, initial encounter for closed fracture: Secondary | ICD-10-CM | POA: Diagnosis not present

## 2023-08-07 DIAGNOSIS — I2723 Pulmonary hypertension due to lung diseases and hypoxia: Secondary | ICD-10-CM | POA: Diagnosis not present

## 2023-08-07 DIAGNOSIS — I272 Pulmonary hypertension, unspecified: Secondary | ICD-10-CM | POA: Diagnosis not present

## 2023-08-07 DIAGNOSIS — S0990XA Unspecified injury of head, initial encounter: Secondary | ICD-10-CM | POA: Diagnosis not present

## 2023-08-07 DIAGNOSIS — J189 Pneumonia, unspecified organism: Secondary | ICD-10-CM | POA: Diagnosis not present

## 2023-08-07 DIAGNOSIS — J440 Chronic obstructive pulmonary disease with acute lower respiratory infection: Secondary | ICD-10-CM | POA: Diagnosis not present

## 2023-08-07 DIAGNOSIS — I129 Hypertensive chronic kidney disease with stage 1 through stage 4 chronic kidney disease, or unspecified chronic kidney disease: Secondary | ICD-10-CM | POA: Diagnosis not present

## 2023-08-07 DIAGNOSIS — R918 Other nonspecific abnormal finding of lung field: Secondary | ICD-10-CM | POA: Diagnosis not present

## 2023-08-07 DIAGNOSIS — I739 Peripheral vascular disease, unspecified: Secondary | ICD-10-CM | POA: Diagnosis not present

## 2023-08-07 DIAGNOSIS — M25551 Pain in right hip: Secondary | ICD-10-CM | POA: Diagnosis not present

## 2023-08-07 DIAGNOSIS — W19XXXA Unspecified fall, initial encounter: Secondary | ICD-10-CM | POA: Diagnosis not present

## 2023-08-07 DIAGNOSIS — R0902 Hypoxemia: Secondary | ICD-10-CM | POA: Diagnosis not present

## 2023-08-07 DIAGNOSIS — N183 Chronic kidney disease, stage 3 unspecified: Secondary | ICD-10-CM | POA: Diagnosis not present

## 2023-08-07 DIAGNOSIS — M47812 Spondylosis without myelopathy or radiculopathy, cervical region: Secondary | ICD-10-CM | POA: Diagnosis not present

## 2023-08-07 DIAGNOSIS — Z66 Do not resuscitate: Secondary | ICD-10-CM | POA: Diagnosis not present

## 2023-08-07 DIAGNOSIS — M79621 Pain in right upper arm: Secondary | ICD-10-CM | POA: Diagnosis not present

## 2023-08-07 DIAGNOSIS — S29009A Unspecified injury of muscle and tendon of unspecified wall of thorax, initial encounter: Secondary | ICD-10-CM | POA: Diagnosis not present

## 2023-08-07 DIAGNOSIS — J188 Other pneumonia, unspecified organism: Secondary | ICD-10-CM | POA: Diagnosis not present

## 2023-08-07 DIAGNOSIS — I10 Essential (primary) hypertension: Secondary | ICD-10-CM | POA: Diagnosis not present

## 2023-08-07 DIAGNOSIS — R0600 Dyspnea, unspecified: Secondary | ICD-10-CM | POA: Diagnosis not present

## 2023-08-07 DIAGNOSIS — J9621 Acute and chronic respiratory failure with hypoxia: Secondary | ICD-10-CM | POA: Diagnosis not present

## 2023-08-07 DIAGNOSIS — J9601 Acute respiratory failure with hypoxia: Secondary | ICD-10-CM | POA: Diagnosis not present

## 2023-08-07 DIAGNOSIS — M25521 Pain in right elbow: Secondary | ICD-10-CM | POA: Diagnosis not present

## 2023-08-07 DIAGNOSIS — J159 Unspecified bacterial pneumonia: Secondary | ICD-10-CM | POA: Diagnosis not present

## 2023-08-07 DIAGNOSIS — M4802 Spinal stenosis, cervical region: Secondary | ICD-10-CM | POA: Diagnosis not present

## 2023-08-07 DIAGNOSIS — S51811A Laceration without foreign body of right forearm, initial encounter: Secondary | ICD-10-CM | POA: Diagnosis not present

## 2023-08-07 DIAGNOSIS — J984 Other disorders of lung: Secondary | ICD-10-CM | POA: Diagnosis not present

## 2023-08-07 DIAGNOSIS — J449 Chronic obstructive pulmonary disease, unspecified: Secondary | ICD-10-CM | POA: Diagnosis not present

## 2023-08-07 DIAGNOSIS — E119 Type 2 diabetes mellitus without complications: Secondary | ICD-10-CM | POA: Diagnosis not present

## 2023-08-14 ENCOUNTER — Encounter

## 2023-08-16 DIAGNOSIS — R0902 Hypoxemia: Secondary | ICD-10-CM | POA: Diagnosis not present

## 2023-08-19 DIAGNOSIS — I10 Essential (primary) hypertension: Secondary | ICD-10-CM | POA: Diagnosis not present

## 2023-08-19 DIAGNOSIS — E1142 Type 2 diabetes mellitus with diabetic polyneuropathy: Secondary | ICD-10-CM | POA: Diagnosis not present

## 2023-08-19 DIAGNOSIS — E785 Hyperlipidemia, unspecified: Secondary | ICD-10-CM | POA: Diagnosis not present

## 2023-08-19 DIAGNOSIS — M8589 Other specified disorders of bone density and structure, multiple sites: Secondary | ICD-10-CM | POA: Diagnosis not present

## 2023-08-19 DIAGNOSIS — N1832 Chronic kidney disease, stage 3b: Secondary | ICD-10-CM | POA: Diagnosis not present

## 2023-08-19 DIAGNOSIS — E1169 Type 2 diabetes mellitus with other specified complication: Secondary | ICD-10-CM | POA: Diagnosis not present

## 2023-08-21 ENCOUNTER — Encounter

## 2023-08-22 DIAGNOSIS — J189 Pneumonia, unspecified organism: Secondary | ICD-10-CM | POA: Diagnosis not present

## 2023-08-22 DIAGNOSIS — I10 Essential (primary) hypertension: Secondary | ICD-10-CM | POA: Diagnosis not present

## 2023-08-22 DIAGNOSIS — J440 Chronic obstructive pulmonary disease with acute lower respiratory infection: Secondary | ICD-10-CM | POA: Diagnosis not present

## 2023-08-22 DIAGNOSIS — S52514D Nondisplaced fracture of right radial styloid process, subsequent encounter for closed fracture with routine healing: Secondary | ICD-10-CM | POA: Diagnosis not present

## 2023-08-22 DIAGNOSIS — M1811 Unilateral primary osteoarthritis of first carpometacarpal joint, right hand: Secondary | ICD-10-CM | POA: Diagnosis not present

## 2023-08-22 DIAGNOSIS — E1142 Type 2 diabetes mellitus with diabetic polyneuropathy: Secondary | ICD-10-CM | POA: Diagnosis not present

## 2023-08-22 DIAGNOSIS — S41101D Unspecified open wound of right upper arm, subsequent encounter: Secondary | ICD-10-CM | POA: Diagnosis not present

## 2023-08-22 DIAGNOSIS — I70203 Unspecified atherosclerosis of native arteries of extremities, bilateral legs: Secondary | ICD-10-CM | POA: Diagnosis not present

## 2023-08-22 DIAGNOSIS — E1151 Type 2 diabetes mellitus with diabetic peripheral angiopathy without gangrene: Secondary | ICD-10-CM | POA: Diagnosis not present

## 2023-08-24 DIAGNOSIS — E1142 Type 2 diabetes mellitus with diabetic polyneuropathy: Secondary | ICD-10-CM | POA: Diagnosis not present

## 2023-08-25 DIAGNOSIS — I70203 Unspecified atherosclerosis of native arteries of extremities, bilateral legs: Secondary | ICD-10-CM | POA: Diagnosis not present

## 2023-08-25 DIAGNOSIS — J440 Chronic obstructive pulmonary disease with acute lower respiratory infection: Secondary | ICD-10-CM | POA: Diagnosis not present

## 2023-08-25 DIAGNOSIS — E1151 Type 2 diabetes mellitus with diabetic peripheral angiopathy without gangrene: Secondary | ICD-10-CM | POA: Diagnosis not present

## 2023-08-25 DIAGNOSIS — E1142 Type 2 diabetes mellitus with diabetic polyneuropathy: Secondary | ICD-10-CM | POA: Diagnosis not present

## 2023-08-25 DIAGNOSIS — S41101D Unspecified open wound of right upper arm, subsequent encounter: Secondary | ICD-10-CM | POA: Diagnosis not present

## 2023-08-25 DIAGNOSIS — M1811 Unilateral primary osteoarthritis of first carpometacarpal joint, right hand: Secondary | ICD-10-CM | POA: Diagnosis not present

## 2023-08-25 DIAGNOSIS — S52514D Nondisplaced fracture of right radial styloid process, subsequent encounter for closed fracture with routine healing: Secondary | ICD-10-CM | POA: Diagnosis not present

## 2023-08-25 DIAGNOSIS — J189 Pneumonia, unspecified organism: Secondary | ICD-10-CM | POA: Diagnosis not present

## 2023-08-25 DIAGNOSIS — I10 Essential (primary) hypertension: Secondary | ICD-10-CM | POA: Diagnosis not present

## 2023-08-26 DIAGNOSIS — S52514D Nondisplaced fracture of right radial styloid process, subsequent encounter for closed fracture with routine healing: Secondary | ICD-10-CM | POA: Diagnosis not present

## 2023-08-26 DIAGNOSIS — J189 Pneumonia, unspecified organism: Secondary | ICD-10-CM | POA: Diagnosis not present

## 2023-08-26 DIAGNOSIS — I10 Essential (primary) hypertension: Secondary | ICD-10-CM | POA: Diagnosis not present

## 2023-08-26 DIAGNOSIS — E1151 Type 2 diabetes mellitus with diabetic peripheral angiopathy without gangrene: Secondary | ICD-10-CM | POA: Diagnosis not present

## 2023-08-26 DIAGNOSIS — S41101D Unspecified open wound of right upper arm, subsequent encounter: Secondary | ICD-10-CM | POA: Diagnosis not present

## 2023-08-26 DIAGNOSIS — M1811 Unilateral primary osteoarthritis of first carpometacarpal joint, right hand: Secondary | ICD-10-CM | POA: Diagnosis not present

## 2023-08-26 DIAGNOSIS — J440 Chronic obstructive pulmonary disease with acute lower respiratory infection: Secondary | ICD-10-CM | POA: Diagnosis not present

## 2023-08-26 DIAGNOSIS — I70203 Unspecified atherosclerosis of native arteries of extremities, bilateral legs: Secondary | ICD-10-CM | POA: Diagnosis not present

## 2023-08-26 DIAGNOSIS — E1142 Type 2 diabetes mellitus with diabetic polyneuropathy: Secondary | ICD-10-CM | POA: Diagnosis not present

## 2023-08-28 ENCOUNTER — Encounter

## 2023-08-28 DIAGNOSIS — I70203 Unspecified atherosclerosis of native arteries of extremities, bilateral legs: Secondary | ICD-10-CM | POA: Diagnosis not present

## 2023-08-28 DIAGNOSIS — J440 Chronic obstructive pulmonary disease with acute lower respiratory infection: Secondary | ICD-10-CM | POA: Diagnosis not present

## 2023-08-28 DIAGNOSIS — E1142 Type 2 diabetes mellitus with diabetic polyneuropathy: Secondary | ICD-10-CM | POA: Diagnosis not present

## 2023-08-28 DIAGNOSIS — J189 Pneumonia, unspecified organism: Secondary | ICD-10-CM | POA: Diagnosis not present

## 2023-08-28 DIAGNOSIS — S41101D Unspecified open wound of right upper arm, subsequent encounter: Secondary | ICD-10-CM | POA: Diagnosis not present

## 2023-08-28 DIAGNOSIS — M1811 Unilateral primary osteoarthritis of first carpometacarpal joint, right hand: Secondary | ICD-10-CM | POA: Diagnosis not present

## 2023-08-28 DIAGNOSIS — S52514D Nondisplaced fracture of right radial styloid process, subsequent encounter for closed fracture with routine healing: Secondary | ICD-10-CM | POA: Diagnosis not present

## 2023-08-28 DIAGNOSIS — I10 Essential (primary) hypertension: Secondary | ICD-10-CM | POA: Diagnosis not present

## 2023-08-28 DIAGNOSIS — E1151 Type 2 diabetes mellitus with diabetic peripheral angiopathy without gangrene: Secondary | ICD-10-CM | POA: Diagnosis not present

## 2023-09-01 DIAGNOSIS — I70203 Unspecified atherosclerosis of native arteries of extremities, bilateral legs: Secondary | ICD-10-CM | POA: Diagnosis not present

## 2023-09-01 DIAGNOSIS — S52514D Nondisplaced fracture of right radial styloid process, subsequent encounter for closed fracture with routine healing: Secondary | ICD-10-CM | POA: Diagnosis not present

## 2023-09-01 DIAGNOSIS — E1142 Type 2 diabetes mellitus with diabetic polyneuropathy: Secondary | ICD-10-CM | POA: Diagnosis not present

## 2023-09-01 DIAGNOSIS — I10 Essential (primary) hypertension: Secondary | ICD-10-CM | POA: Diagnosis not present

## 2023-09-01 DIAGNOSIS — E1151 Type 2 diabetes mellitus with diabetic peripheral angiopathy without gangrene: Secondary | ICD-10-CM | POA: Diagnosis not present

## 2023-09-01 DIAGNOSIS — J189 Pneumonia, unspecified organism: Secondary | ICD-10-CM | POA: Diagnosis not present

## 2023-09-01 DIAGNOSIS — S41101D Unspecified open wound of right upper arm, subsequent encounter: Secondary | ICD-10-CM | POA: Diagnosis not present

## 2023-09-01 DIAGNOSIS — M1811 Unilateral primary osteoarthritis of first carpometacarpal joint, right hand: Secondary | ICD-10-CM | POA: Diagnosis not present

## 2023-09-01 DIAGNOSIS — J440 Chronic obstructive pulmonary disease with acute lower respiratory infection: Secondary | ICD-10-CM | POA: Diagnosis not present

## 2023-09-02 ENCOUNTER — Encounter

## 2023-09-02 DIAGNOSIS — S52571D Other intraarticular fracture of lower end of right radius, subsequent encounter for closed fracture with routine healing: Secondary | ICD-10-CM | POA: Diagnosis not present

## 2023-09-02 DIAGNOSIS — S52515A Nondisplaced fracture of left radial styloid process, initial encounter for closed fracture: Secondary | ICD-10-CM | POA: Diagnosis not present

## 2023-09-02 DIAGNOSIS — S52514D Nondisplaced fracture of right radial styloid process, subsequent encounter for closed fracture with routine healing: Secondary | ICD-10-CM | POA: Diagnosis not present

## 2023-09-04 ENCOUNTER — Encounter

## 2023-09-05 DIAGNOSIS — I70203 Unspecified atherosclerosis of native arteries of extremities, bilateral legs: Secondary | ICD-10-CM | POA: Diagnosis not present

## 2023-09-05 DIAGNOSIS — E1142 Type 2 diabetes mellitus with diabetic polyneuropathy: Secondary | ICD-10-CM | POA: Diagnosis not present

## 2023-09-05 DIAGNOSIS — S52514D Nondisplaced fracture of right radial styloid process, subsequent encounter for closed fracture with routine healing: Secondary | ICD-10-CM | POA: Diagnosis not present

## 2023-09-05 DIAGNOSIS — I10 Essential (primary) hypertension: Secondary | ICD-10-CM | POA: Diagnosis not present

## 2023-09-05 DIAGNOSIS — M1811 Unilateral primary osteoarthritis of first carpometacarpal joint, right hand: Secondary | ICD-10-CM | POA: Diagnosis not present

## 2023-09-05 DIAGNOSIS — E1151 Type 2 diabetes mellitus with diabetic peripheral angiopathy without gangrene: Secondary | ICD-10-CM | POA: Diagnosis not present

## 2023-09-05 DIAGNOSIS — J440 Chronic obstructive pulmonary disease with acute lower respiratory infection: Secondary | ICD-10-CM | POA: Diagnosis not present

## 2023-09-05 DIAGNOSIS — S41101D Unspecified open wound of right upper arm, subsequent encounter: Secondary | ICD-10-CM | POA: Diagnosis not present

## 2023-09-05 DIAGNOSIS — J189 Pneumonia, unspecified organism: Secondary | ICD-10-CM | POA: Diagnosis not present

## 2023-09-08 DIAGNOSIS — I10 Essential (primary) hypertension: Secondary | ICD-10-CM | POA: Diagnosis not present

## 2023-09-08 DIAGNOSIS — J440 Chronic obstructive pulmonary disease with acute lower respiratory infection: Secondary | ICD-10-CM | POA: Diagnosis not present

## 2023-09-08 DIAGNOSIS — S41101D Unspecified open wound of right upper arm, subsequent encounter: Secondary | ICD-10-CM | POA: Diagnosis not present

## 2023-09-08 DIAGNOSIS — E1151 Type 2 diabetes mellitus with diabetic peripheral angiopathy without gangrene: Secondary | ICD-10-CM | POA: Diagnosis not present

## 2023-09-08 DIAGNOSIS — R0902 Hypoxemia: Secondary | ICD-10-CM | POA: Diagnosis not present

## 2023-09-08 DIAGNOSIS — S52514D Nondisplaced fracture of right radial styloid process, subsequent encounter for closed fracture with routine healing: Secondary | ICD-10-CM | POA: Diagnosis not present

## 2023-09-08 DIAGNOSIS — M1811 Unilateral primary osteoarthritis of first carpometacarpal joint, right hand: Secondary | ICD-10-CM | POA: Diagnosis not present

## 2023-09-08 DIAGNOSIS — J189 Pneumonia, unspecified organism: Secondary | ICD-10-CM | POA: Diagnosis not present

## 2023-09-08 DIAGNOSIS — E1142 Type 2 diabetes mellitus with diabetic polyneuropathy: Secondary | ICD-10-CM | POA: Diagnosis not present

## 2023-09-08 DIAGNOSIS — I70203 Unspecified atherosclerosis of native arteries of extremities, bilateral legs: Secondary | ICD-10-CM | POA: Diagnosis not present

## 2023-09-11 ENCOUNTER — Encounter

## 2023-09-11 DIAGNOSIS — M47812 Spondylosis without myelopathy or radiculopathy, cervical region: Secondary | ICD-10-CM | POA: Diagnosis not present

## 2023-09-11 DIAGNOSIS — M1711 Unilateral primary osteoarthritis, right knee: Secondary | ICD-10-CM | POA: Diagnosis not present

## 2023-09-11 DIAGNOSIS — M160 Bilateral primary osteoarthritis of hip: Secondary | ICD-10-CM | POA: Diagnosis not present

## 2023-09-11 DIAGNOSIS — J432 Centrilobular emphysema: Secondary | ICD-10-CM | POA: Diagnosis not present

## 2023-09-11 DIAGNOSIS — M4312 Spondylolisthesis, cervical region: Secondary | ICD-10-CM | POA: Diagnosis not present

## 2023-09-11 DIAGNOSIS — S52514D Nondisplaced fracture of right radial styloid process, subsequent encounter for closed fracture with routine healing: Secondary | ICD-10-CM | POA: Diagnosis not present

## 2023-09-11 DIAGNOSIS — M1811 Unilateral primary osteoarthritis of first carpometacarpal joint, right hand: Secondary | ICD-10-CM | POA: Diagnosis not present

## 2023-09-11 DIAGNOSIS — M19031 Primary osteoarthritis, right wrist: Secondary | ICD-10-CM | POA: Diagnosis not present

## 2023-09-11 DIAGNOSIS — E1142 Type 2 diabetes mellitus with diabetic polyneuropathy: Secondary | ICD-10-CM | POA: Diagnosis not present

## 2023-09-16 ENCOUNTER — Encounter

## 2023-09-17 DIAGNOSIS — M1811 Unilateral primary osteoarthritis of first carpometacarpal joint, right hand: Secondary | ICD-10-CM | POA: Diagnosis not present

## 2023-09-17 DIAGNOSIS — S52514D Nondisplaced fracture of right radial styloid process, subsequent encounter for closed fracture with routine healing: Secondary | ICD-10-CM | POA: Diagnosis not present

## 2023-09-17 DIAGNOSIS — M19031 Primary osteoarthritis, right wrist: Secondary | ICD-10-CM | POA: Diagnosis not present

## 2023-09-17 DIAGNOSIS — M160 Bilateral primary osteoarthritis of hip: Secondary | ICD-10-CM | POA: Diagnosis not present

## 2023-09-17 DIAGNOSIS — M47812 Spondylosis without myelopathy or radiculopathy, cervical region: Secondary | ICD-10-CM | POA: Diagnosis not present

## 2023-09-17 DIAGNOSIS — J432 Centrilobular emphysema: Secondary | ICD-10-CM | POA: Diagnosis not present

## 2023-09-17 DIAGNOSIS — E1142 Type 2 diabetes mellitus with diabetic polyneuropathy: Secondary | ICD-10-CM | POA: Diagnosis not present

## 2023-09-17 DIAGNOSIS — M4312 Spondylolisthesis, cervical region: Secondary | ICD-10-CM | POA: Diagnosis not present

## 2023-09-17 DIAGNOSIS — M1711 Unilateral primary osteoarthritis, right knee: Secondary | ICD-10-CM | POA: Diagnosis not present

## 2023-09-18 ENCOUNTER — Encounter

## 2023-09-23 ENCOUNTER — Encounter

## 2023-09-23 DIAGNOSIS — E1142 Type 2 diabetes mellitus with diabetic polyneuropathy: Secondary | ICD-10-CM | POA: Diagnosis not present

## 2023-09-24 DIAGNOSIS — M160 Bilateral primary osteoarthritis of hip: Secondary | ICD-10-CM | POA: Diagnosis not present

## 2023-09-24 DIAGNOSIS — M4312 Spondylolisthesis, cervical region: Secondary | ICD-10-CM | POA: Diagnosis not present

## 2023-09-24 DIAGNOSIS — E1142 Type 2 diabetes mellitus with diabetic polyneuropathy: Secondary | ICD-10-CM | POA: Diagnosis not present

## 2023-09-24 DIAGNOSIS — S52514D Nondisplaced fracture of right radial styloid process, subsequent encounter for closed fracture with routine healing: Secondary | ICD-10-CM | POA: Diagnosis not present

## 2023-09-24 DIAGNOSIS — M47812 Spondylosis without myelopathy or radiculopathy, cervical region: Secondary | ICD-10-CM | POA: Diagnosis not present

## 2023-09-24 DIAGNOSIS — J432 Centrilobular emphysema: Secondary | ICD-10-CM | POA: Diagnosis not present

## 2023-09-24 DIAGNOSIS — M19031 Primary osteoarthritis, right wrist: Secondary | ICD-10-CM | POA: Diagnosis not present

## 2023-09-24 DIAGNOSIS — M1811 Unilateral primary osteoarthritis of first carpometacarpal joint, right hand: Secondary | ICD-10-CM | POA: Diagnosis not present

## 2023-09-24 DIAGNOSIS — M1711 Unilateral primary osteoarthritis, right knee: Secondary | ICD-10-CM | POA: Diagnosis not present

## 2023-09-25 ENCOUNTER — Encounter

## 2023-09-26 DIAGNOSIS — J432 Centrilobular emphysema: Secondary | ICD-10-CM | POA: Diagnosis not present

## 2023-09-26 DIAGNOSIS — M1711 Unilateral primary osteoarthritis, right knee: Secondary | ICD-10-CM | POA: Diagnosis not present

## 2023-09-26 DIAGNOSIS — S52514D Nondisplaced fracture of right radial styloid process, subsequent encounter for closed fracture with routine healing: Secondary | ICD-10-CM | POA: Diagnosis not present

## 2023-09-26 DIAGNOSIS — M47812 Spondylosis without myelopathy or radiculopathy, cervical region: Secondary | ICD-10-CM | POA: Diagnosis not present

## 2023-09-26 DIAGNOSIS — E1142 Type 2 diabetes mellitus with diabetic polyneuropathy: Secondary | ICD-10-CM | POA: Diagnosis not present

## 2023-09-26 DIAGNOSIS — M19031 Primary osteoarthritis, right wrist: Secondary | ICD-10-CM | POA: Diagnosis not present

## 2023-09-26 DIAGNOSIS — M160 Bilateral primary osteoarthritis of hip: Secondary | ICD-10-CM | POA: Diagnosis not present

## 2023-09-26 DIAGNOSIS — M4312 Spondylolisthesis, cervical region: Secondary | ICD-10-CM | POA: Diagnosis not present

## 2023-09-26 DIAGNOSIS — M1811 Unilateral primary osteoarthritis of first carpometacarpal joint, right hand: Secondary | ICD-10-CM | POA: Diagnosis not present

## 2023-09-30 ENCOUNTER — Encounter

## 2023-09-30 DIAGNOSIS — S52501D Unspecified fracture of the lower end of right radius, subsequent encounter for closed fracture with routine healing: Secondary | ICD-10-CM | POA: Diagnosis not present

## 2023-09-30 DIAGNOSIS — S52591D Other fractures of lower end of right radius, subsequent encounter for closed fracture with routine healing: Secondary | ICD-10-CM | POA: Diagnosis not present

## 2023-10-01 DIAGNOSIS — E1169 Type 2 diabetes mellitus with other specified complication: Secondary | ICD-10-CM | POA: Diagnosis not present

## 2023-10-01 DIAGNOSIS — E785 Hyperlipidemia, unspecified: Secondary | ICD-10-CM | POA: Diagnosis not present

## 2023-10-01 DIAGNOSIS — M8589 Other specified disorders of bone density and structure, multiple sites: Secondary | ICD-10-CM | POA: Diagnosis not present

## 2023-10-01 DIAGNOSIS — I10 Essential (primary) hypertension: Secondary | ICD-10-CM | POA: Diagnosis not present

## 2023-10-01 DIAGNOSIS — N1832 Chronic kidney disease, stage 3b: Secondary | ICD-10-CM | POA: Diagnosis not present

## 2023-10-02 ENCOUNTER — Encounter

## 2023-10-03 DIAGNOSIS — M1711 Unilateral primary osteoarthritis, right knee: Secondary | ICD-10-CM | POA: Diagnosis not present

## 2023-10-03 DIAGNOSIS — J432 Centrilobular emphysema: Secondary | ICD-10-CM | POA: Diagnosis not present

## 2023-10-03 DIAGNOSIS — M47812 Spondylosis without myelopathy or radiculopathy, cervical region: Secondary | ICD-10-CM | POA: Diagnosis not present

## 2023-10-03 DIAGNOSIS — E1142 Type 2 diabetes mellitus with diabetic polyneuropathy: Secondary | ICD-10-CM | POA: Diagnosis not present

## 2023-10-03 DIAGNOSIS — M19031 Primary osteoarthritis, right wrist: Secondary | ICD-10-CM | POA: Diagnosis not present

## 2023-10-03 DIAGNOSIS — S52514D Nondisplaced fracture of right radial styloid process, subsequent encounter for closed fracture with routine healing: Secondary | ICD-10-CM | POA: Diagnosis not present

## 2023-10-03 DIAGNOSIS — M4312 Spondylolisthesis, cervical region: Secondary | ICD-10-CM | POA: Diagnosis not present

## 2023-10-03 DIAGNOSIS — M160 Bilateral primary osteoarthritis of hip: Secondary | ICD-10-CM | POA: Diagnosis not present

## 2023-10-03 DIAGNOSIS — M1811 Unilateral primary osteoarthritis of first carpometacarpal joint, right hand: Secondary | ICD-10-CM | POA: Diagnosis not present

## 2023-10-07 ENCOUNTER — Encounter

## 2023-10-09 ENCOUNTER — Encounter

## 2023-10-09 DIAGNOSIS — J432 Centrilobular emphysema: Secondary | ICD-10-CM | POA: Diagnosis not present

## 2023-10-09 DIAGNOSIS — M47812 Spondylosis without myelopathy or radiculopathy, cervical region: Secondary | ICD-10-CM | POA: Diagnosis not present

## 2023-10-09 DIAGNOSIS — M1711 Unilateral primary osteoarthritis, right knee: Secondary | ICD-10-CM | POA: Diagnosis not present

## 2023-10-09 DIAGNOSIS — S52514D Nondisplaced fracture of right radial styloid process, subsequent encounter for closed fracture with routine healing: Secondary | ICD-10-CM | POA: Diagnosis not present

## 2023-10-09 DIAGNOSIS — M160 Bilateral primary osteoarthritis of hip: Secondary | ICD-10-CM | POA: Diagnosis not present

## 2023-10-09 DIAGNOSIS — M1811 Unilateral primary osteoarthritis of first carpometacarpal joint, right hand: Secondary | ICD-10-CM | POA: Diagnosis not present

## 2023-10-09 DIAGNOSIS — E1142 Type 2 diabetes mellitus with diabetic polyneuropathy: Secondary | ICD-10-CM | POA: Diagnosis not present

## 2023-10-09 DIAGNOSIS — M4312 Spondylolisthesis, cervical region: Secondary | ICD-10-CM | POA: Diagnosis not present

## 2023-10-09 DIAGNOSIS — M19031 Primary osteoarthritis, right wrist: Secondary | ICD-10-CM | POA: Diagnosis not present

## 2023-10-13 DIAGNOSIS — I10 Essential (primary) hypertension: Secondary | ICD-10-CM | POA: Diagnosis not present

## 2023-10-13 DIAGNOSIS — Z794 Long term (current) use of insulin: Secondary | ICD-10-CM | POA: Diagnosis not present

## 2023-10-13 DIAGNOSIS — J189 Pneumonia, unspecified organism: Secondary | ICD-10-CM | POA: Diagnosis not present

## 2023-10-13 DIAGNOSIS — E1142 Type 2 diabetes mellitus with diabetic polyneuropathy: Secondary | ICD-10-CM | POA: Diagnosis not present

## 2023-10-13 DIAGNOSIS — I272 Pulmonary hypertension, unspecified: Secondary | ICD-10-CM | POA: Diagnosis not present

## 2023-10-13 DIAGNOSIS — Z09 Encounter for follow-up examination after completed treatment for conditions other than malignant neoplasm: Secondary | ICD-10-CM | POA: Diagnosis not present

## 2023-10-13 DIAGNOSIS — Z1331 Encounter for screening for depression: Secondary | ICD-10-CM | POA: Diagnosis not present

## 2023-10-13 DIAGNOSIS — R0609 Other forms of dyspnea: Secondary | ICD-10-CM | POA: Diagnosis not present

## 2023-10-14 ENCOUNTER — Encounter

## 2023-10-16 ENCOUNTER — Encounter

## 2023-10-16 DIAGNOSIS — M4312 Spondylolisthesis, cervical region: Secondary | ICD-10-CM | POA: Diagnosis not present

## 2023-10-16 DIAGNOSIS — J432 Centrilobular emphysema: Secondary | ICD-10-CM | POA: Diagnosis not present

## 2023-10-16 DIAGNOSIS — M1811 Unilateral primary osteoarthritis of first carpometacarpal joint, right hand: Secondary | ICD-10-CM | POA: Diagnosis not present

## 2023-10-16 DIAGNOSIS — M47812 Spondylosis without myelopathy or radiculopathy, cervical region: Secondary | ICD-10-CM | POA: Diagnosis not present

## 2023-10-16 DIAGNOSIS — M1711 Unilateral primary osteoarthritis, right knee: Secondary | ICD-10-CM | POA: Diagnosis not present

## 2023-10-16 DIAGNOSIS — E1142 Type 2 diabetes mellitus with diabetic polyneuropathy: Secondary | ICD-10-CM | POA: Diagnosis not present

## 2023-10-16 DIAGNOSIS — M160 Bilateral primary osteoarthritis of hip: Secondary | ICD-10-CM | POA: Diagnosis not present

## 2023-10-16 DIAGNOSIS — M19031 Primary osteoarthritis, right wrist: Secondary | ICD-10-CM | POA: Diagnosis not present

## 2023-10-16 DIAGNOSIS — S52514D Nondisplaced fracture of right radial styloid process, subsequent encounter for closed fracture with routine healing: Secondary | ICD-10-CM | POA: Diagnosis not present

## 2023-10-21 ENCOUNTER — Encounter

## 2023-10-21 DIAGNOSIS — E785 Hyperlipidemia, unspecified: Secondary | ICD-10-CM | POA: Diagnosis not present

## 2023-10-21 DIAGNOSIS — E1169 Type 2 diabetes mellitus with other specified complication: Secondary | ICD-10-CM | POA: Diagnosis not present

## 2023-10-21 DIAGNOSIS — N1832 Chronic kidney disease, stage 3b: Secondary | ICD-10-CM | POA: Diagnosis not present

## 2023-10-21 DIAGNOSIS — I10 Essential (primary) hypertension: Secondary | ICD-10-CM | POA: Diagnosis not present

## 2023-10-21 DIAGNOSIS — M8589 Other specified disorders of bone density and structure, multiple sites: Secondary | ICD-10-CM | POA: Diagnosis not present

## 2023-10-21 DIAGNOSIS — E1142 Type 2 diabetes mellitus with diabetic polyneuropathy: Secondary | ICD-10-CM | POA: Diagnosis not present

## 2023-10-22 ENCOUNTER — Other Ambulatory Visit
Admission: RE | Admit: 2023-10-22 | Discharge: 2023-10-22 | Disposition: A | Discharge status: Home / Self Care | Source: Ambulatory Visit | Attending: Emergency Medicine | Admitting: Emergency Medicine

## 2023-10-22 DIAGNOSIS — Z86711 Personal history of pulmonary embolism: Secondary | ICD-10-CM | POA: Diagnosis not present

## 2023-10-22 DIAGNOSIS — R0600 Dyspnea, unspecified: Secondary | ICD-10-CM | POA: Insufficient documentation

## 2023-10-22 DIAGNOSIS — J432 Centrilobular emphysema: Secondary | ICD-10-CM | POA: Diagnosis not present

## 2023-10-22 DIAGNOSIS — R0602 Shortness of breath: Secondary | ICD-10-CM | POA: Insufficient documentation

## 2023-10-22 LAB — D-DIMER, QUANTITATIVE: D-Dimer, Quant: 2.69 ug{FEU}/mL — ABNORMAL HIGH (ref 0.00–0.50)

## 2023-10-23 ENCOUNTER — Encounter

## 2023-10-23 ENCOUNTER — Other Ambulatory Visit: Payer: Self-pay | Admitting: Emergency Medicine

## 2023-10-23 ENCOUNTER — Ambulatory Visit: Admission: RE | Admit: 2023-10-23 | Source: Ambulatory Visit

## 2023-10-23 DIAGNOSIS — R7989 Other specified abnormal findings of blood chemistry: Secondary | ICD-10-CM

## 2023-10-23 DIAGNOSIS — I2699 Other pulmonary embolism without acute cor pulmonale: Secondary | ICD-10-CM

## 2023-10-23 DIAGNOSIS — R0602 Shortness of breath: Secondary | ICD-10-CM

## 2023-10-24 DIAGNOSIS — E1142 Type 2 diabetes mellitus with diabetic polyneuropathy: Secondary | ICD-10-CM | POA: Diagnosis not present

## 2023-10-29 DIAGNOSIS — M1711 Unilateral primary osteoarthritis, right knee: Secondary | ICD-10-CM | POA: Diagnosis not present

## 2023-10-29 DIAGNOSIS — J432 Centrilobular emphysema: Secondary | ICD-10-CM | POA: Diagnosis not present

## 2023-10-29 DIAGNOSIS — M19031 Primary osteoarthritis, right wrist: Secondary | ICD-10-CM | POA: Diagnosis not present

## 2023-10-29 DIAGNOSIS — S52514D Nondisplaced fracture of right radial styloid process, subsequent encounter for closed fracture with routine healing: Secondary | ICD-10-CM | POA: Diagnosis not present

## 2023-10-29 DIAGNOSIS — M47812 Spondylosis without myelopathy or radiculopathy, cervical region: Secondary | ICD-10-CM | POA: Diagnosis not present

## 2023-10-29 DIAGNOSIS — M1811 Unilateral primary osteoarthritis of first carpometacarpal joint, right hand: Secondary | ICD-10-CM | POA: Diagnosis not present

## 2023-10-29 DIAGNOSIS — M4312 Spondylolisthesis, cervical region: Secondary | ICD-10-CM | POA: Diagnosis not present

## 2023-10-29 DIAGNOSIS — E1142 Type 2 diabetes mellitus with diabetic polyneuropathy: Secondary | ICD-10-CM | POA: Diagnosis not present

## 2023-10-29 DIAGNOSIS — M160 Bilateral primary osteoarthritis of hip: Secondary | ICD-10-CM | POA: Diagnosis not present

## 2023-10-31 ENCOUNTER — Other Ambulatory Visit: Payer: Self-pay | Admitting: Emergency Medicine

## 2023-10-31 DIAGNOSIS — R7989 Other specified abnormal findings of blood chemistry: Secondary | ICD-10-CM

## 2023-10-31 DIAGNOSIS — R0602 Shortness of breath: Secondary | ICD-10-CM

## 2023-10-31 DIAGNOSIS — I2699 Other pulmonary embolism without acute cor pulmonale: Secondary | ICD-10-CM

## 2023-11-03 ENCOUNTER — Ambulatory Visit
Admission: RE | Admit: 2023-11-03 | Discharge: 2023-11-03 | Disposition: A | Discharge status: Home / Self Care | Source: Ambulatory Visit | Attending: Emergency Medicine | Admitting: Emergency Medicine

## 2023-11-03 DIAGNOSIS — R590 Localized enlarged lymph nodes: Secondary | ICD-10-CM | POA: Diagnosis not present

## 2023-11-03 DIAGNOSIS — R0602 Shortness of breath: Secondary | ICD-10-CM | POA: Insufficient documentation

## 2023-11-03 DIAGNOSIS — J439 Emphysema, unspecified: Secondary | ICD-10-CM | POA: Diagnosis not present

## 2023-11-03 DIAGNOSIS — I2699 Other pulmonary embolism without acute cor pulmonale: Secondary | ICD-10-CM | POA: Diagnosis not present

## 2023-11-03 DIAGNOSIS — R7989 Other specified abnormal findings of blood chemistry: Secondary | ICD-10-CM | POA: Diagnosis not present

## 2023-11-03 DIAGNOSIS — R918 Other nonspecific abnormal finding of lung field: Secondary | ICD-10-CM | POA: Diagnosis not present

## 2023-11-03 DIAGNOSIS — K8020 Calculus of gallbladder without cholecystitis without obstruction: Secondary | ICD-10-CM | POA: Diagnosis not present

## 2023-11-03 LAB — POCT I-STAT CREATININE: Creatinine, Ser: 1.5 mg/dL — ABNORMAL HIGH (ref 0.44–1.00)

## 2023-11-03 MED ORDER — IOHEXOL 350 MG/ML SOLN
75.0000 mL | Freq: Once | INTRAVENOUS | Status: AC | PRN
  Administered 2023-11-03: 60 mL via INTRAVENOUS

## 2023-11-07 DIAGNOSIS — M1811 Unilateral primary osteoarthritis of first carpometacarpal joint, right hand: Secondary | ICD-10-CM | POA: Diagnosis not present

## 2023-11-07 DIAGNOSIS — M4312 Spondylolisthesis, cervical region: Secondary | ICD-10-CM | POA: Diagnosis not present

## 2023-11-07 DIAGNOSIS — E1142 Type 2 diabetes mellitus with diabetic polyneuropathy: Secondary | ICD-10-CM | POA: Diagnosis not present

## 2023-11-07 DIAGNOSIS — M47812 Spondylosis without myelopathy or radiculopathy, cervical region: Secondary | ICD-10-CM | POA: Diagnosis not present

## 2023-11-07 DIAGNOSIS — J432 Centrilobular emphysema: Secondary | ICD-10-CM | POA: Diagnosis not present

## 2023-11-07 DIAGNOSIS — M19031 Primary osteoarthritis, right wrist: Secondary | ICD-10-CM | POA: Diagnosis not present

## 2023-11-07 DIAGNOSIS — S52514D Nondisplaced fracture of right radial styloid process, subsequent encounter for closed fracture with routine healing: Secondary | ICD-10-CM | POA: Diagnosis not present

## 2023-11-07 DIAGNOSIS — M160 Bilateral primary osteoarthritis of hip: Secondary | ICD-10-CM | POA: Diagnosis not present

## 2023-11-07 DIAGNOSIS — M1711 Unilateral primary osteoarthritis, right knee: Secondary | ICD-10-CM | POA: Diagnosis not present

## 2023-11-11 DIAGNOSIS — M069 Rheumatoid arthritis, unspecified: Secondary | ICD-10-CM | POA: Diagnosis not present

## 2023-11-11 DIAGNOSIS — Z86711 Personal history of pulmonary embolism: Secondary | ICD-10-CM | POA: Diagnosis not present

## 2023-11-11 DIAGNOSIS — I272 Pulmonary hypertension, unspecified: Secondary | ICD-10-CM | POA: Diagnosis not present

## 2023-11-11 DIAGNOSIS — I739 Peripheral vascular disease, unspecified: Secondary | ICD-10-CM | POA: Diagnosis not present

## 2023-11-11 DIAGNOSIS — M0510 Rheumatoid lung disease with rheumatoid arthritis of unspecified site: Secondary | ICD-10-CM | POA: Diagnosis not present

## 2023-11-11 DIAGNOSIS — R911 Solitary pulmonary nodule: Secondary | ICD-10-CM | POA: Diagnosis not present

## 2023-11-12 DIAGNOSIS — J432 Centrilobular emphysema: Secondary | ICD-10-CM | POA: Diagnosis not present

## 2023-11-12 DIAGNOSIS — M160 Bilateral primary osteoarthritis of hip: Secondary | ICD-10-CM | POA: Diagnosis not present

## 2023-11-12 DIAGNOSIS — E1142 Type 2 diabetes mellitus with diabetic polyneuropathy: Secondary | ICD-10-CM | POA: Diagnosis not present

## 2023-11-12 DIAGNOSIS — S52514D Nondisplaced fracture of right radial styloid process, subsequent encounter for closed fracture with routine healing: Secondary | ICD-10-CM | POA: Diagnosis not present

## 2023-11-12 DIAGNOSIS — M1811 Unilateral primary osteoarthritis of first carpometacarpal joint, right hand: Secondary | ICD-10-CM | POA: Diagnosis not present

## 2023-11-12 DIAGNOSIS — M47812 Spondylosis without myelopathy or radiculopathy, cervical region: Secondary | ICD-10-CM | POA: Diagnosis not present

## 2023-11-12 DIAGNOSIS — M4312 Spondylolisthesis, cervical region: Secondary | ICD-10-CM | POA: Diagnosis not present

## 2023-11-12 DIAGNOSIS — M19031 Primary osteoarthritis, right wrist: Secondary | ICD-10-CM | POA: Diagnosis not present

## 2023-11-12 DIAGNOSIS — M1711 Unilateral primary osteoarthritis, right knee: Secondary | ICD-10-CM | POA: Diagnosis not present

## 2023-11-14 ENCOUNTER — Other Ambulatory Visit: Payer: Self-pay | Admitting: Pulmonary Disease

## 2023-11-17 DIAGNOSIS — M47812 Spondylosis without myelopathy or radiculopathy, cervical region: Secondary | ICD-10-CM | POA: Diagnosis not present

## 2023-11-17 DIAGNOSIS — M1811 Unilateral primary osteoarthritis of first carpometacarpal joint, right hand: Secondary | ICD-10-CM | POA: Diagnosis not present

## 2023-11-17 DIAGNOSIS — M1711 Unilateral primary osteoarthritis, right knee: Secondary | ICD-10-CM | POA: Diagnosis not present

## 2023-11-17 DIAGNOSIS — E1169 Type 2 diabetes mellitus with other specified complication: Secondary | ICD-10-CM | POA: Diagnosis not present

## 2023-11-17 DIAGNOSIS — E785 Hyperlipidemia, unspecified: Secondary | ICD-10-CM | POA: Diagnosis not present

## 2023-11-17 DIAGNOSIS — J432 Centrilobular emphysema: Secondary | ICD-10-CM | POA: Diagnosis not present

## 2023-11-17 DIAGNOSIS — E1142 Type 2 diabetes mellitus with diabetic polyneuropathy: Secondary | ICD-10-CM | POA: Diagnosis not present

## 2023-11-17 DIAGNOSIS — I10 Essential (primary) hypertension: Secondary | ICD-10-CM | POA: Diagnosis not present

## 2023-11-17 DIAGNOSIS — M19031 Primary osteoarthritis, right wrist: Secondary | ICD-10-CM | POA: Diagnosis not present

## 2023-11-17 DIAGNOSIS — M160 Bilateral primary osteoarthritis of hip: Secondary | ICD-10-CM | POA: Diagnosis not present

## 2023-11-17 DIAGNOSIS — S52514D Nondisplaced fracture of right radial styloid process, subsequent encounter for closed fracture with routine healing: Secondary | ICD-10-CM | POA: Diagnosis not present

## 2023-11-17 DIAGNOSIS — N1832 Chronic kidney disease, stage 3b: Secondary | ICD-10-CM | POA: Diagnosis not present

## 2023-11-17 DIAGNOSIS — M4312 Spondylolisthesis, cervical region: Secondary | ICD-10-CM | POA: Diagnosis not present

## 2023-11-19 DIAGNOSIS — Z86711 Personal history of pulmonary embolism: Secondary | ICD-10-CM | POA: Diagnosis not present

## 2023-11-19 DIAGNOSIS — I739 Peripheral vascular disease, unspecified: Secondary | ICD-10-CM | POA: Diagnosis not present

## 2023-11-19 DIAGNOSIS — I272 Pulmonary hypertension, unspecified: Secondary | ICD-10-CM | POA: Diagnosis not present

## 2023-11-19 DIAGNOSIS — E785 Hyperlipidemia, unspecified: Secondary | ICD-10-CM | POA: Diagnosis not present

## 2023-11-19 DIAGNOSIS — Z794 Long term (current) use of insulin: Secondary | ICD-10-CM | POA: Diagnosis not present

## 2023-11-19 DIAGNOSIS — K2100 Gastro-esophageal reflux disease with esophagitis, without bleeding: Secondary | ICD-10-CM | POA: Diagnosis not present

## 2023-11-19 DIAGNOSIS — E1142 Type 2 diabetes mellitus with diabetic polyneuropathy: Secondary | ICD-10-CM | POA: Diagnosis not present

## 2023-11-19 DIAGNOSIS — E1169 Type 2 diabetes mellitus with other specified complication: Secondary | ICD-10-CM | POA: Diagnosis not present

## 2023-11-21 ENCOUNTER — Other Ambulatory Visit: Payer: Self-pay

## 2023-11-21 ENCOUNTER — Encounter
Admission: RE | Admit: 2023-11-21 | Discharge: 2023-11-21 | Disposition: A | Discharge status: Home / Self Care | Source: Ambulatory Visit | Attending: Pulmonary Disease | Admitting: Pulmonary Disease

## 2023-11-21 VITALS — Ht 64.0 in | Wt 137.0 lb

## 2023-11-21 DIAGNOSIS — M47812 Spondylosis without myelopathy or radiculopathy, cervical region: Secondary | ICD-10-CM | POA: Diagnosis not present

## 2023-11-21 DIAGNOSIS — E1169 Type 2 diabetes mellitus with other specified complication: Secondary | ICD-10-CM

## 2023-11-21 DIAGNOSIS — Z01812 Encounter for preprocedural laboratory examination: Secondary | ICD-10-CM

## 2023-11-21 HISTORY — DX: Centrilobular emphysema: J43.2

## 2023-11-21 HISTORY — DX: Dyspnea, unspecified: R06.00

## 2023-11-21 HISTORY — DX: Pulmonary hypertension, unspecified: I27.20

## 2023-11-21 HISTORY — DX: Rheumatoid lung disease with rheumatoid arthritis of unspecified site: M05.10

## 2023-11-21 NOTE — Progress Notes (Signed)
 Pre -admit interview was completed  and patient stated that she is not currently taking Eliquis and Plavix  for weeks instructed by Dr. Melodye and that Dr. Florencio is aware about it. Patient also stated that she has not been taking aspirin 81 mg for years as well.

## 2023-11-21 NOTE — Patient Instructions (Addendum)
 Your procedure is scheduled on: 11/28/2023  Report to the Registration Desk on the 1st floor of the [facility]. To find out your arrival time, please call [phone] between 1PM - 3PM on: 9/18 If your arrival time is 6:00 am, do not arrive before that time as the [facility] entrance doors do not open until 6:00 am.  REMEMBER: Instructions that are not followed completely may result in serious medical risk, up to and including death; or upon the discretion of your surgeon and anesthesiologist your surgery may need to be rescheduled.  Do not eat food after midnight the night before surgery.  No gum chewing or hard candies.  You may however, drink CLEAR liquids up to 2 hours before you are scheduled to arrive for your surgery. Do not drink anything within 2 hours of your scheduled arrival time.  Clear liquids include: - water      One week prior to surgery: Stop Anti-inflammatories (NSAIDS) such as Advil, Aleve, Ibuprofen, Motrin, Naproxen, Naprosyn and Aspirin based products such as Excedrin, Goody's Powder, BC Powder. Stop ANY OVER THE COUNTER supplements until after surgery like B-12,multivitamin,  You may however, continue to take Tylenol  if needed for pain up until the day of surgery.  **Follow guidelines for insulin and diabetes medications.**  Only give half dose of lantus at bedtime.   **Follow recommendations regarding stopping blood thinners.**  Plavix - please remember the last dose because the Pre-op team will ask you this ELiquis- Please remember the last dose because the Pre-op team will ask you this.  Continue taking all of your other prescription medications up until the day of surgery.  ON THE DAY OF SURGERY ONLY TAKE THESE MEDICATIONS WITH SIPS OF WATER:  metoprolol  succinate (TOPROL -XL omeprazole (PRILOSEC) pregabalin (LYRICA) rosuvastatin (CRESTOR) dexamethasone  (DECADRON )  amLODipine (NORVASC)  Use inhalers on the day of surgery and bring to the  hospital.  No Alcohol  for 24 hours before or after surgery.  No Smoking including e-cigarettes for 24 hours before surgery.  No chewable tobacco products for at least 6 hours before surgery.  No nicotine patches on the day of surgery.  Do not use any recreational drugs for at least a week (preferably 2 weeks) before your surgery.  Please be advised that the combination of cocaine and anesthesia may have negative outcomes, up to and including death. If you test positive for cocaine, your surgery will be cancelled.  On the morning of surgery brush your teeth with toothpaste and water, you may rinse your mouth with mouthwash if you wish. Do not swallow any toothpaste or mouthwash.  You may shower on day of surgery,  Do not wear jewelry, make-up, hairpins, clips or nail polish.   Do not wear lotions, powders, or perfumes.   Do not shave body hair from the neck down 48 hours before surgery.  Contact lenses, hearing aids and dentures may not be worn into surgery.  Do not bring valuables to the hospital. [facility] is not responsible for any missing/lost belongings or valuables.   Notify your doctor if there is any change in your medical condition (cold, fever, infection).  Wear comfortable clothing (specific to your surgery type) to the hospital.  After surgery, you can help prevent lung complications by doing breathing exercises.  Take deep breaths and cough every 1-2 hours. Your doctor may order a device called an Incentive Spirometer to help you take deep breaths.   If you are being discharged the day of surgery, you will  not be allowed to drive home. You will need a responsible individual to drive you home and stay with you for 24 hours after surgery.    Please call the Pre-admissions Testing Dept. at [phone] if you have any questions about these instructions.  Surgery Visitation Policy:  Patients having surgery or a procedure may have two visitors.  Children  under the age of 82 must have an adult with them who is not the patient.  Inpatient Visitation:    Visiting hours are 7 a.m. to 8 p.m. Up to four visitors are allowed at one time in a patient room. The visitors may rotate out with other people during the day.  One visitor age 76 or older may stay with the patient overnight and must be in the room by 8 p.m.   Merchandiser, retail to address health-related social needs:  [email or web address]

## 2023-11-22 NOTE — Progress Notes (Signed)
 Established Patient Visit   Chief Complaint: Chief Complaint  Patient presents with  . Follow-up    1 year- POC   Date of Service: 11/19/2023 Date of Birth: 04/19/1931 PCP: [name], NP  History of Present Illness: Katie Cunningham is a 89 y.o.female patient who   Past Medical and Surgical History  Past Medical History Past Medical History:  Diagnosis Date  . Acute pulmonary embolism without acute cor pulmonale (CMS/HHS-HCC) 06/13/2021  . Anemia   . Atypical migraine   . Barrett esophagus    on 05/2013 EGD  . Carotid atherosclerosis 09/23/2013   50-69% right, > 70% left by 05/07/2013 dopplers   . Chronic diarrhea   . Chronic kidney disease, stage III (moderate) (CMS/HHS-HCC)   . Colon polyp   . CRI (chronic renal insufficiency), stage 2 (mild) 09/23/2013  . Dermatitis   . Diverticulitis   . Essential hypertension, benign   . GERD (gastroesophageal reflux disease)   . History of cancer   . Hypercalcemia due to sarcoidosis 09/28/2013  . Hyperlipidemia   . Hypomagnesemia 09/29/2014  . Lichen planopilaris of vulva 01/15/2022  . Osteoarthritis   . Osteopenia   . Peripheral arterial disease () 11/20/2015   S/P bilateral iliac stents by Dr. Jama   . Peripheral neuropathy   . Postmenopausal 1987  . Pulmonary embolism (CMS/HHS-HCC)   . Pulmonary hypertension (CMS/HHS-HCC) 11/10/2019   On 09/16/2019 echocardiogram; refused right heart catheterization  . Reflux esophagitis    on 05/2013 EGD  . Rheumatoid arthritis (CMS/HHS-HCC)   . Rotator cuff tear    RIGHT ARM  . Scoliosis of lumbar spine    right convex  . Type 2 diabetes, controlled, with retinopathy (CMS/HHS-HCC)    Dr. Deward  . Vasomotor rhinitis     Past Surgical History She has a past surgical history that includes Tonsillectomy & Adenoidectomy (as a child); Appendectomy (65 years old); Percutaneous Biopsy Breast (Left); Cataract extraction (Bilateral, OD 1994, OS 2008); Basal cell excision; Colonoscopy  (04/24/2004); Colonoscopy (05/12/2013); Upper gastrointestinal endoscopy (05/12/2013); Polypectomy; endovascular transluminal balloon angioplasty iliac artery w/stent (Bilateral, 11/07/2015); Appendectomy; and Tonsillectomy.   Medications and Allergies  Current Medications  Current Outpatient Medications  Medication Sig Dispense Refill  . acetaminophen  (TYLENOL ) 650 MG ER tablet Take 1,300 mg by mouth every 8 (eight) hours as needed for Pain    . cholecalciferol (VITAMIN D3) 5,000 unit capsule Take 1 capsule (5,000 Units total) by mouth once daily 360 capsule 11  . dexAMETHasone  (DECADRON ) 1 MG tablet Take 1 tablet (1 mg total) by mouth 2 (two) times daily with meals for 60 days 60 tablet 1  . fluticasone-umeclidinium-vilanterol (TRELEGY ELLIPTA) 100-62.5-25 mcg inhaler INHALE 1 PUFF INTO LUNGS ONCE DAILY 180 each 3  . lancets (ACCU-CHEK FASTCLIX LANCET DRUM) 3 (three) times daily Use as instructed. 102 each 12  . LANTUS SOLOSTAR U-100 INSULIN  pen injector (concentration 100 units/mL) Inject 17 Units subcutaneously at bedtime 15 mL 12  . metoprolol  SUCCinate (TOPROL -XL) 25 MG XL tablet Take 0.5 tablets (12.5 mg total) by mouth once daily 90 tablet 0  . multivitamin tablet Take 1 tablet by mouth once daily.    . NOVOLOG  FLEXPEN U-100 INSULIN  pen injector (concentration 100 units/mL) INJECT 4 TO 6 UNITS SUBCUTANEOUSLY WITH A MEAL. MAY ADD SLIDING SCALE IF NECESSARY, MAX DAILY DOSE 50 UNITS 15 mL 4  . omeprazole (PRILOSEC) 20 MG DR capsule Take 20 mg by mouth once daily.    . pen needle, diabetic (BD ULTRA-FINE MICRO  PEN NEEDLE) 32 gauge x 1/4 needle as directed USE 1 PEN NEEDLE FOUR TIMES DAILY 300 each 4  . rosuvastatin (CRESTOR) 20 MG tablet Take 1 tablet by mouth once daily 90 tablet 4  . VITAMIN B-12 1,000 mcg SL tablet Take 1,000 mcg by mouth once daily       . ACCU-CHEK SMARTVIEW TEST STRIP test strip USE 1 STRIP TO CHECK GLUCOSE THREE TIMES DAILY WHEN OFF SENSOR OR SENSOR NOT WORKING:  DX=  E11.22 100 each 6  . clopidogreL  (PLAVIX ) 75 mg tablet Take 75 mg by mouth once daily (Patient not taking: Reported on 11/19/2023)    . coenzyme Q10 100 mg Chew Take 100 mg by mouth once daily (Patient not taking: Reported on 11/19/2023)    . FUROsemide (LASIX) 20 MG tablet Take 1 tablet (20 mg total) by mouth once daily (Patient not taking: Reported on 11/19/2023) 90 tablet 1  . pregabalin (LYRICA) 100 MG capsule Take 1 capsule (100 mg total) by mouth 2 (two) times daily 60 capsule 3  . triamcinolone  acetonide (ORALONE) 0.1 % paste APPLY  TOPICALLY TWICE DAILY TO  THE  INFLAMED  AREAS  OR  ULCERS  IN  YOUR  MOUTH. (Patient not taking: Reported on 11/19/2023) 5 g 0   No current facility-administered medications for this visit.    Allergies: Atorvastatin and Colestipol  Social and Family History  Social History  reports that she quit smoking about 35 years ago. Her smoking use included cigarettes. She started smoking about 75 years ago. She has a 40 pack-year smoking history. She has never used smokeless tobacco. She reports current alcohol  use of about 14.0 standard drinks of alcohol  per week. She reports that she does not use drugs.  Family History Family History  Problem Relation Name Age of Onset  . Coronary Artery Disease (Blocked arteries around heart) Father    . Alcohol  abuse Father    . High blood pressure (Hypertension) Mother Katie Cunningham   . Diabetes type II Mother Katie Cunningham   . Diabetes type II Sister Katie Cunningham   . High blood pressure (Hypertension) Sister Katie Cunningham   . Breast cancer Sister Katie Cunningham 65  . Leukemia Sister Probation officer   . Cancer Sister Katie Cunningham   . Heart disease Brother Katie Cunningham   . Diabetes type II Brother Katie Cunningham     Review of Systems   Review of Systems: The patient denies chest pain, shortness of breath, orthopnea, paroxysmal nocturnal dyspnea, pedal edema, palpitations, heart racing, presyncope, syncope. Review of 12 Systems is negative except as described  above.  Physical Examination   Vitals:BP 136/78 (BP Location: Left upper arm, Patient Position: Sitting, BP Cuff Size: Adult)   Pulse 83   Resp 16   Ht 165.1 cm (5' 5)   Wt 59 kg (130 lb)   LMP  (LMP Unknown)   SpO2 94% Comment: 2L O2  BMI 21.63 kg/m  Ht:165.1 cm (5' 5) Wt:59 kg (130 lb) ADJ:Anib surface area is 1.64 meters squared. Body mass index is 21.63 kg/m.  HEENT: Pupils equally reactive to light and accomodation  Neck: Supple without thyromegaly, carotid pulses 2+ Lungs: clear to auscultation bilaterally; no wheezes, rales, rhonchi Heart: Regular rate and rhythm.  No gallops, murmurs or rub Abdomen: soft nontender, nondistended, with normal bowel sounds Extremities: no cyanosis, clubbing, or edema Peripheral Pulses: 2+ in all extremities, 2+ femoral pulses bilaterally Neurologic: Alert and oriented X3; speech intact; face symmetrical; moves all extremities well  Assessment   89  y.o. female with  1. Pulmonary hypertension (CMS/HHS-HCC)   2. Hyperlipidemia due to type 2 diabetes mellitus  (CMS/HHS-HCC)   3. Type 2 diabetes mellitus without complication, with long-term current use of insulin  (CMS/HHS-HCC)   4. Polyneuropathy associated with underlying disease (CMS/HHS-HCC)   5. Type 2 diabetes mellitus with diabetic polyneuropathy, with long-term current use of insulin  (CMS/HHS-HCC)   6. Peripheral arterial disease ()   7. Hx of pulmonary embolus   8. Gastroesophageal reflux disease with esophagitis, unspecified whether hemorrhage        Plan       Return in about 6 months (around 05/18/2024).  DWAYNE D CALLWOOD, MD  This dictation was prepared with dragon dictation.  Any transcription errors that result from this process are unintentional.

## 2023-11-24 DIAGNOSIS — M1811 Unilateral primary osteoarthritis of first carpometacarpal joint, right hand: Secondary | ICD-10-CM | POA: Diagnosis not present

## 2023-11-24 DIAGNOSIS — M19031 Primary osteoarthritis, right wrist: Secondary | ICD-10-CM | POA: Diagnosis not present

## 2023-11-24 DIAGNOSIS — M47812 Spondylosis without myelopathy or radiculopathy, cervical region: Secondary | ICD-10-CM | POA: Diagnosis not present

## 2023-11-24 DIAGNOSIS — M1711 Unilateral primary osteoarthritis, right knee: Secondary | ICD-10-CM | POA: Diagnosis not present

## 2023-11-24 DIAGNOSIS — J432 Centrilobular emphysema: Secondary | ICD-10-CM | POA: Diagnosis not present

## 2023-11-24 DIAGNOSIS — S52514D Nondisplaced fracture of right radial styloid process, subsequent encounter for closed fracture with routine healing: Secondary | ICD-10-CM | POA: Diagnosis not present

## 2023-11-24 DIAGNOSIS — M4312 Spondylolisthesis, cervical region: Secondary | ICD-10-CM | POA: Diagnosis not present

## 2023-11-24 DIAGNOSIS — E1142 Type 2 diabetes mellitus with diabetic polyneuropathy: Secondary | ICD-10-CM | POA: Diagnosis not present

## 2023-11-24 DIAGNOSIS — M160 Bilateral primary osteoarthritis of hip: Secondary | ICD-10-CM | POA: Diagnosis not present

## 2023-11-25 ENCOUNTER — Encounter: Payer: Self-pay | Admitting: Pulmonary Disease

## 2023-11-25 NOTE — Progress Notes (Signed)
 Perioperative / Anesthesia Services  Pre-Admission Testing Clinical Review / Pre-Operative Anesthesia Consult  Date: 11/25/23  PATIENT DEMOGRAPHICS: Name: Katie Cunningham DOB: 29-Jan-1932 MRN:   [id number]  Note: Available PAT nursing documentation and vital signs have been reviewed. Clinical nursing staff has updated patient's PMH/PSHx, current medication list, and drug allergies/intolerances to ensure complete and comprehensive history available to assist care teams in MDM as it pertains to the aforementioned surgical procedure and anticipated anesthetic course. Extensive review of available clinical information personally performed. [facility] updated with any diagnoses/procedures that  may have been inadvertently omitted during her intake with the pre-admission testing department's nursing staff.  PLANNED SURGICAL PROCEDURE(S):   Case: [id number] Date/Time: 11/28/23 1038   Procedures:      BRONCHOSCOPY, FLEXIBLE     BRONCHOSCOPY, WITH EBUS   Anesthesia type: General   Diagnosis:      Lung nodule [R91.1]     Lymphadenopathy [R59.1]   Pre-op diagnosis:      R91.1 Lung nodule     R59.1 Lymphadenopathy   Location: [facility] PROCEDURE RM 02 / [facility] ORS FOR ANESTHESIA GROUP   Surgeons: Parris Manna, MD        CLINICAL DISCUSSION: Katie Cunningham is a 89 y.o. female who is submitted for pre-surgical anesthesia review and clearance prior to her undergoing the above procedure. Patient is a Former Smoker (39 pack years). Pertinent PMH includes: CAD, mitral stenosis, BILATERAL carotid artery stenosis, PE, aortic atherosclerosis, cardiomegaly, HTN, HLD, T2DM, CKD-III, dyspnea, central lobar emphysema, rheumatoid lung disease, RIGHT upper lobe pulmonary nodule, GERD (on daily PPI), Barrett's esophagus, anemia, OA, scoliosis, lumbar DDD.  Patient is followed by cardiology Philippe, MD). She was last seen in the cardiology clinic on ***; notes reviewed. ***At the time of her  clinic visit, patient doing well overall from a cardiovascular perspective. Patient denied any chest pain, shortness of breath, PND, orthopnea, palpitations, significant peripheral edema, weakness, fatigue, vertiginous symptoms, or presyncope/syncope. Patient with a past medical history significant for cardiovascular diagnoses. Documented physical exam was grossly benign, providing no evidence of acute exacerbation and/or decompensation of the patient's known cardiovascular conditions.  ***  Blood pressure***controlled at *** mmHg on currently prescribed *** therapies.  Patient is on *** for her HLD diagnosis and ASCVD prevention. ***Patient is not diabetic. ***She does not have an OSAH diagnosis. ***FC. No changes were made to her medication regimen during her visit with cardiology.  Patient scheduled to follow-up with outpatient cardiology in***months or sooner if needed.  Katie Cunningham underwent CTA imaging of the chest on 11/03/2023 revealing debris within the RIGHT upper lobe bronchial.  There was associated peribronchovascular ground glass nodule like airspace opacities in this region measuring up to 7 mm.  No discrete pulmonary mass was identified.  Additionally, there was enlarged RIGHT hilar R lymphadenopathy measuring up to 1.7 cm.  Patient was referred to pulmonary medicine for further evaluation and discussions regarding direct visualization with potential biopsy.  Patient has subsequently been scheduled for a FLEXIBLE BRONCHOSCOPY WITH EBUS on 11/28/2023 with Dr. Manna Parris, MD.   Given patient's past medical history significant for cardiovascular diagnoses, presurgical cardiac clearance was sought by the PAT team. Per cardiology, this patient is optimized for surgery and may proceed with the planned procedural course with a LOW risk of significant perioperative cardiovascular complications.  Per patient's medication list, she has been prescribed daily DAPT therapy including ASA +  clopidogrel .  Additionally, patient prescribed chronic DOAC therapy using apixaban.  Per patient report, she has not been taking his medications and her cardiologist is aware.  Patient denies previous perioperative complications with anesthesia in the past. In review her EMR, there are no records available for review pertaining to any anesthetic courses within the [facility] in the recent past.   MOST RECENT VITAL SIGNS:    11/21/2023   12:18 PM 02/18/2022    2:12 PM 01/22/2021    1:58 PM  Vitals with BMI  Height 5' 4 5' 5 5' 5  Weight 137 lbs 139 lbs 140 lbs  BMI 23.5 23.13 23.3  Systolic  146 77  Diastolic  72 47  Pulse  94 86   PROVIDERS/SPECIALISTS: NOTE: Primary physician provider listed below. Patient may have been seen by APP or partner within same practice.   PROVIDER ROLE / SPECIALTY LAST SHERLEAN Parris Manna, MD [facility] (Surgeon) 10/22/2023  Steva Clotilda DEL, NP Primary Care Provider 10/13/2023  Florencio Shine, MD Cardiology 11/19/2023   ALLERGIES: Allergies  Allergen Reactions   Atorvastatin Other (See Comments)   Colestipol Other (See Comments)    Heartburn    CURRENT HOME MEDICATIONS: No current facility-administered medications for this encounter.    acetaminophen  (TYLENOL ) 325 MG tablet   amLODipine (NORVASC) 10 MG tablet   apixaban (ELIQUIS) 5 MG TABS tablet   aspirin 81 MG chewable tablet   cholecalciferol (VITAMIN D) 400 units TABS tablet   clopidogrel  (PLAVIX ) 75 MG tablet   Cyanocobalamin  (VITAMIN B-12) 1000 MCG SUBL   dexamethasone  (DECADRON ) 1 MG tablet   Fluticasone-Umeclidin-Vilant (TRELEGY ELLIPTA) 100-62.5-25 MCG/ACT AEPB   LANTUS SOLOSTAR 100 UNIT/ML Solostar Pen   metoprolol  succinate (TOPROL -XL) 100 MG 24 hr tablet   Multiple Vitamin (MULTIVITAMIN) tablet   NOVOLOG  FLEXPEN 100 UNIT/ML FlexPen   omeprazole (PRILOSEC) 40 MG capsule   pregabalin (LYRICA) 100 MG capsule   rosuvastatin (CRESTOR) 10 MG tablet   vitamin B-12  (CYANOCOBALAMIN ) 250 MCG tablet   HISTORY: Past Medical History:  Diagnosis Date   Anemia    Arthritis    Atypical migraine    Barrett esophagus    Bilateral carotid artery disease (HCC)    Centrilobular emphysema (HCC)    Chronic diarrhea    CKD (chronic kidney disease), stage III (HCC)    Colon polyps    DDD (degenerative disc disease), lumbar    Diverticulitis    Dyspnea    GERD (gastroesophageal reflux disease)    Hyperlipidemia    Hypertension    Lichen planopilaris of vulva    Mitral stenosis    Osteopenia    Peripheral neuropathy    Peripheral vascular disease (HCC)    a.) s/p BILATERAL iliac stenting   Polyneuropathy    Pulmonary embolism (HCC)    Pulmonary hypertension (HCC)    Rheumatoid lung disease (HCC)    Scoliosis    Squamous cell skin cancer (nose, neck, leg)    T2DM (type 2 diabetes mellitus) (HCC)    Vasomotor rhinitis    Past Surgical History:  Procedure Laterality Date   APPENDECTOMY     BREAST BIOPSY Left    neg   PERIPHERAL VASCULAR CATHETERIZATION Right 11/07/2015   Procedure: Lower Extremity Angiography;  Surgeon: Cordella KANDICE Shawl, MD;  Location: [facility] INVASIVE CV LAB;  Service: Cardiovascular;  Laterality: Right;   TONSILLECTOMY     Family History  Problem Relation Age of Onset   Breast cancer Sister 51   Social History   Tobacco Use   Smoking status:  Former    Current packs/day: 1.00    Average packs/day: 1 pack/day for 39.0 years (39.0 ttl pk-yrs)    Types: Cigarettes    Passive exposure: Never   Smokeless tobacco: Never  Substance Use Topics   Alcohol  use: Yes    Alcohol /week: 2.0 standard drinks of alcohol     Types: 2 Glasses of wine per week   LABS:  Component Ref Range & Units 10/22/2023  WBC (White Blood Cell Count) 4.1 - 10.2 10^3/uL 7.8  RBC (Red Blood Cell Count) 4.04 - 5.48 10^6/uL 4.46  Hemoglobin 12.0 - 15.0 gm/dL 86.1  Hematocrit 64.9 - 47.0 % 40.7  MCV (Mean Corpuscular Volume) 80.0 - 100.0 fl 91.3  MCH  (Mean Corpuscular Hemoglobin) 27.0 - 31.2 pg 30.9  MCHC (Mean Corpuscular Hemoglobin Concentration) 32.0 - 36.0 gm/dL 66.0  Platelet Count 849 - 450 10^3/uL 180  RDW-CV (Red Cell Distribution Width) 11.6 - 14.8 % 15.1 High   MPV (Mean Platelet Volume) 9.4 - 12.4 fl 11.5  Neutrophils 1.50 - 7.80 10^3/uL 4.86  Lymphocytes 1.00 - 3.60 10^3/uL 1.79  Monocytes 0.00 - 1.50 10^3/uL 0.99  Eosinophils 0.00 - 0.55 10^3/uL 0.08  Basophils 0.00 - 0.09 10^3/uL 0.01  Neutrophil % 32.0 - 70.0 % 62.6  Lymphocyte % 10.0 - 50.0 % 23.1  Monocyte % 4.0 - 13.0 % 12.8  Eosinophil % 1.0 - 5.0 % 1.0  Basophil% 0.0 - 2.0 % 0.1  Immature Granulocyte % <=0.7 % 0.4  Immature Granulocyte Count <=0.06 10^3/L 0.03     Component Ref Range & Units 10/13/2023  Glucose 70 - 110 mg/dL 825 High   Sodium 863 - 145 mmol/L 139  Potassium 3.6 - 5.1 mmol/L 4.4  Chloride 97 - 109 mmol/L 99  Carbon Dioxide (CO2) 22.0 - 32.0 mmol/L 29.3  Urea  Nitrogen (BUN) 7 - 25 mg/dL 33 High   Creatinine 0.6 - 1.1 mg/dL 1.6 High   Glomerular Filtration Rate (eGFR) >60 mL/min/1.73sq m 31 Low   Calcium 8.7 - 10.3 mg/dL 89.5 High   AST 8 - 39 U/L 26  ALT 5 - 38 U/L 32  Alk Phos (alkaline Phosphatase) 34 - 104 U/L 110 High   Albumin  3.5 - 4.8 g/dL 4.5  Bilirubin, Total 0.3 - 1.2 mg/dL 0.8  Protein, Total 6.1 - 7.9 g/dL 7.7  A/G Ratio 1.0 - 5.0 gm/dL 1.4    ECG: Date: 94/68/7974  Time ECG obtained: 1431 PM Rate: 115 bpm Rhythm: sinus tachycardia Axis (leads I and aVF): right Intervals: PR 146 ms. QRS 76 ms. QTc 437 ms. ST segment and T wave changes: No evidence of acute T wave abnormalities or significant ST segment elevation or depression.  Evidence of a possible, age undetermined, prior infarct:  No Comparison: Similar to previous tracing obtained on 08/07/2023 NOTE: Tracing obtained at [facility]; unable for review. Above based on cardiologist's interpretation.    IMAGING / PROCEDURES: CT ANGIO  CHEST PULMONARY EMBOLISM (PE) W OR WO CONTRAST performed on 11/03/2023 No pulmonary embolus. Debris within a right upper lobe bronchial with associated peribronchovascular ground-glass nodule like airspace opacities in this region measuring up to 7 mm. Finding may represent mucous plugging with bronchiolitis. Indeterminate stable right lymphadenopathy. Finding may be reactive in etiology. Recommend attention on follow-up. Cardiomegaly. Emphysema Aortic atherosclerosis  Sour-vessel and aortic valve leaflet calcifications-correlate with aortic stenosis. Cholelithiasis with no acute cholecystitis.  VAS US  ABI WITH/WO TBI performed on 02/18/2022 Resting bilateral ankle-brachial indexes indicate noncompressible lower extremity arteries.  Doppler waveforms  and normal TBIs suggest normal perfusion to the bilateral lower extremities.      VAS US  CAROTID performed on 02/18/2022 Velocities in the right ICA are consistent with a 1-39% stenosis. The ECA appears >50% stenosed.  Velocities in the left ICA are consistent with a 40-59% stenosis.   TRANSTHORACIC ECHOCARDIOGRAM performed on 09/06/2021 The left ventricular systolic function is normal, LVEF is visually estimated at > 55%.  The right ventricle is normal in size, with normal systolic function.  There is mild mitral stenosis.  There is aortic sclerosis. There is moderate pulmonary hypertension; PASP = 55 mmHg  RIGHT HEART CATHETERIZATION performed on 06/07/2021 Right atrium Mean 2 mmHg  Right ventricle 55/5 mmHg   Pulmonary artery 55/22 mmHg; mean 34 mmHg  Pulmonary capillary wedge mean 11 mmHg  Arterial saturation: 99%  Mixed venous saturation: 73%  Cardiac output: 5.54 L/min  Cardiac index: 3.33 L/min/m^2   IMPRESSION AND PLAN: Katie Cunningham has been referred for pre-anesthesia review and clearance prior to her undergoing the planned anesthetic and procedural courses. Available labs, pertinent testing, and imaging results were  personally reviewed by me in preparation for upcoming operative/procedural course. [facility] medical record has been updated following extensive record review and patient interview with PAT staff.   ATTENTION --> PENDING CLEARANCE AT THIS TIME -- NOTE/CONTENTS NOT FINAL UNTIL SIGNED This patient has been appropriately cleared by cardiology with an overall *** risk of patient experiencing significant perioperative cardiovascular complications. Given her degree of pulmonary hypertension, patient was discussed with attending anesthesiologist on call Bernette, MD). PMH and recent diagnostics discussed. Per MD, patient cleared to proceed with planned procedure here at [facility].   Based on clinical review performed today (11/25/23), barring any significant acute changes in the patient's overall condition, it is anticipated that she will be able to proceed with the planned surgical intervention. Any acute changes in clinical condition may necessitate her procedure being postponed and/or cancelled. Patient will meet with anesthesia team (MD and/or CRNA) on the day of her procedure for preoperative evaluation/assessment. Questions regarding anesthetic course will be fielded at that time.   Pre-surgical instructions were reviewed with the patient during his PAT appointment, and questions were fielded to satisfaction by PAT clinical staff. She has been instructed on which medications that she will need to hold prior to surgery, as well as the ones that have been deemed safe/appropriate to take on the day of her procedure. As part of the general education provided by PAT, patient made aware both verbally and in writing, that she would need to abstain from the use of any illegal substances during her perioperative course. She was advised that failure to follow the provided instructions could necessitate case cancellation or result in serious perioperative complications up to and  including death. Patient encouraged to contact PAT and/or her surgeon's office to discuss any questions or concerns that may arise prior to surgery; verbalized understanding.   Katie Pereyra, MSN, APRN, FNP-C, CEN [facility]  Perioperative Services Nurse Practitioner Phone: [phone] Fax: [phone] 11/25/23 1:45 PM  NOTE: This note has been prepared using Dragon dictation software. Despite my best ability to proofread, there is always the potential that unintentional transcriptional errors may still occur from this process.

## 2023-11-28 ENCOUNTER — Ambulatory Visit: Payer: Self-pay | Admitting: Urgent Care

## 2023-11-28 ENCOUNTER — Encounter
Admission: RE | Disposition: A | Discharge status: Home / Self Care | Payer: Self-pay | Source: Home / Self Care | Attending: Pulmonary Disease

## 2023-11-28 ENCOUNTER — Encounter: Payer: Self-pay | Admitting: Pulmonary Disease

## 2023-11-28 ENCOUNTER — Other Ambulatory Visit: Payer: Self-pay

## 2023-11-28 ENCOUNTER — Ambulatory Visit

## 2023-11-28 ENCOUNTER — Ambulatory Visit
Admission: RE | Admit: 2023-11-28 | Discharge: 2023-11-28 | Disposition: A | Discharge status: Home / Self Care | Attending: Pulmonary Disease | Admitting: Pulmonary Disease

## 2023-11-28 DIAGNOSIS — N183 Chronic kidney disease, stage 3 unspecified: Secondary | ICD-10-CM | POA: Insufficient documentation

## 2023-11-28 DIAGNOSIS — Z01812 Encounter for preprocedural laboratory examination: Secondary | ICD-10-CM

## 2023-11-28 DIAGNOSIS — R911 Solitary pulmonary nodule: Secondary | ICD-10-CM | POA: Diagnosis not present

## 2023-11-28 DIAGNOSIS — T17590A Other foreign object in bronchus causing asphyxiation, initial encounter: Secondary | ICD-10-CM | POA: Diagnosis not present

## 2023-11-28 DIAGNOSIS — E1122 Type 2 diabetes mellitus with diabetic chronic kidney disease: Secondary | ICD-10-CM | POA: Diagnosis not present

## 2023-11-28 DIAGNOSIS — J432 Centrilobular emphysema: Secondary | ICD-10-CM | POA: Insufficient documentation

## 2023-11-28 DIAGNOSIS — R9389 Abnormal findings on diagnostic imaging of other specified body structures: Secondary | ICD-10-CM | POA: Diagnosis not present

## 2023-11-28 DIAGNOSIS — R590 Localized enlarged lymph nodes: Secondary | ICD-10-CM | POA: Diagnosis not present

## 2023-11-28 DIAGNOSIS — R0902 Hypoxemia: Secondary | ICD-10-CM | POA: Diagnosis not present

## 2023-11-28 DIAGNOSIS — E785 Hyperlipidemia, unspecified: Secondary | ICD-10-CM | POA: Insufficient documentation

## 2023-11-28 DIAGNOSIS — I700 Atherosclerosis of aorta: Secondary | ICD-10-CM | POA: Insufficient documentation

## 2023-11-28 DIAGNOSIS — I129 Hypertensive chronic kidney disease with stage 1 through stage 4 chronic kidney disease, or unspecified chronic kidney disease: Secondary | ICD-10-CM | POA: Diagnosis not present

## 2023-11-28 DIAGNOSIS — K219 Gastro-esophageal reflux disease without esophagitis: Secondary | ICD-10-CM | POA: Diagnosis not present

## 2023-11-28 DIAGNOSIS — I2510 Atherosclerotic heart disease of native coronary artery without angina pectoris: Secondary | ICD-10-CM | POA: Insufficient documentation

## 2023-11-28 DIAGNOSIS — Z9981 Dependence on supplemental oxygen: Secondary | ICD-10-CM | POA: Insufficient documentation

## 2023-11-28 DIAGNOSIS — J849 Interstitial pulmonary disease, unspecified: Secondary | ICD-10-CM | POA: Diagnosis not present

## 2023-11-28 DIAGNOSIS — E1151 Type 2 diabetes mellitus with diabetic peripheral angiopathy without gangrene: Secondary | ICD-10-CM | POA: Insufficient documentation

## 2023-11-28 DIAGNOSIS — R846 Abnormal cytological findings in specimens from respiratory organs and thorax: Secondary | ICD-10-CM | POA: Diagnosis not present

## 2023-11-28 DIAGNOSIS — I272 Pulmonary hypertension, unspecified: Secondary | ICD-10-CM | POA: Insufficient documentation

## 2023-11-28 DIAGNOSIS — M0510 Rheumatoid lung disease with rheumatoid arthritis of unspecified site: Secondary | ICD-10-CM | POA: Diagnosis not present

## 2023-11-28 DIAGNOSIS — I350 Nonrheumatic aortic (valve) stenosis: Secondary | ICD-10-CM | POA: Diagnosis not present

## 2023-11-28 DIAGNOSIS — R519 Headache, unspecified: Secondary | ICD-10-CM | POA: Diagnosis not present

## 2023-11-28 DIAGNOSIS — Z86711 Personal history of pulmonary embolism: Secondary | ICD-10-CM | POA: Insufficient documentation

## 2023-11-28 DIAGNOSIS — Z7982 Long term (current) use of aspirin: Secondary | ICD-10-CM | POA: Insufficient documentation

## 2023-11-28 DIAGNOSIS — E1169 Type 2 diabetes mellitus with other specified complication: Secondary | ICD-10-CM

## 2023-11-28 DIAGNOSIS — Z7902 Long term (current) use of antithrombotics/antiplatelets: Secondary | ICD-10-CM | POA: Diagnosis not present

## 2023-11-28 DIAGNOSIS — D631 Anemia in chronic kidney disease: Secondary | ICD-10-CM | POA: Diagnosis not present

## 2023-11-28 DIAGNOSIS — Z87891 Personal history of nicotine dependence: Secondary | ICD-10-CM | POA: Insufficient documentation

## 2023-11-28 DIAGNOSIS — Z48813 Encounter for surgical aftercare following surgery on the respiratory system: Secondary | ICD-10-CM | POA: Diagnosis not present

## 2023-11-28 DIAGNOSIS — I1310 Hypertensive heart and chronic kidney disease without heart failure, with stage 1 through stage 4 chronic kidney disease, or unspecified chronic kidney disease: Secondary | ICD-10-CM | POA: Insufficient documentation

## 2023-11-28 DIAGNOSIS — X58XXXA Exposure to other specified factors, initial encounter: Secondary | ICD-10-CM | POA: Diagnosis not present

## 2023-11-28 DIAGNOSIS — R591 Generalized enlarged lymph nodes: Secondary | ICD-10-CM | POA: Diagnosis not present

## 2023-11-28 DIAGNOSIS — Z794 Long term (current) use of insulin: Secondary | ICD-10-CM | POA: Diagnosis not present

## 2023-11-28 HISTORY — DX: Lichen planopilaris, unspecified: L66.10

## 2023-11-28 HISTORY — DX: Polyneuropathy, unspecified: G62.9

## 2023-11-28 HISTORY — DX: Scoliosis, unspecified: M41.9

## 2023-11-28 HISTORY — DX: Rheumatic mitral stenosis: I05.0

## 2023-11-28 HISTORY — DX: Atherosclerotic heart disease of native coronary artery without angina pectoris: I25.10

## 2023-11-28 HISTORY — DX: Type 2 diabetes mellitus without complications: E11.9

## 2023-11-28 HISTORY — DX: Anemia, unspecified: D64.9

## 2023-11-28 HISTORY — DX: Squamous cell carcinoma of skin, unspecified: C44.92

## 2023-11-28 HISTORY — DX: Calculus of gallbladder without cholecystitis without obstruction: K80.20

## 2023-11-28 HISTORY — PX: VIDEO BRONCHOSCOPY WITH ENDOBRONCHIAL ULTRASOUND: SHX6177

## 2023-11-28 HISTORY — DX: Atherosclerosis of aorta: I70.0

## 2023-11-28 HISTORY — DX: Solitary pulmonary nodule: R91.1

## 2023-11-28 HISTORY — DX: Diverticulitis of intestine, part unspecified, without perforation or abscess without bleeding: K57.92

## 2023-11-28 HISTORY — DX: Barrett's esophagus without dysplasia: K22.70

## 2023-11-28 HISTORY — DX: Other intervertebral disc degeneration, lumbar region without mention of lumbar back pain or lower extremity pain: M51.369

## 2023-11-28 HISTORY — DX: Disorder of arteries and arterioles, unspecified: I77.9

## 2023-11-28 HISTORY — DX: Migraine without aura, not intractable, without status migrainosus: G43.009

## 2023-11-28 HISTORY — DX: Other pulmonary embolism without acute cor pulmonale: I26.99

## 2023-11-28 HISTORY — PX: FLEXIBLE BRONCHOSCOPY: SHX5094

## 2023-11-28 HISTORY — DX: Vasomotor rhinitis: J30.0

## 2023-11-28 HISTORY — DX: Chronic kidney disease, stage 3 unspecified: N18.30

## 2023-11-28 HISTORY — DX: Polyp of colon: K63.5

## 2023-11-28 HISTORY — DX: Noninfective gastroenteritis and colitis, unspecified: K52.9

## 2023-11-28 HISTORY — DX: Other specified disorders of bone density and structure, unspecified site: M85.80

## 2023-11-28 HISTORY — DX: Cardiomegaly: I51.7

## 2023-11-28 LAB — GLUCOSE, CAPILLARY
Glucose-Capillary: 382 mg/dL — ABNORMAL HIGH (ref 70–99)
Glucose-Capillary: 202 mg/dL — ABNORMAL HIGH (ref 70–99)

## 2023-11-28 SURGERY — BRONCHOSCOPY, FLEXIBLE
Anesthesia: General

## 2023-11-28 MED ORDER — EPHEDRINE SULFATE-NACL 50-0.9 MG/10ML-% IV SOSY
PREFILLED_SYRINGE | INTRAVENOUS | Status: DC | PRN
  Administered 2023-11-28 (×2): 10 mg via INTRAVENOUS
  Administered 2023-11-28: 5 mg via INTRAVENOUS

## 2023-11-28 MED ORDER — INSULIN ASPART 100 UNIT/ML IJ SOLN
10.0000 [IU] | Freq: Once | INTRAMUSCULAR | Status: AC
Start: 2023-11-28 — End: 2023-11-28
  Administered 2023-11-28: 10 [IU] via SUBCUTANEOUS

## 2023-11-28 MED ORDER — DEXAMETHASONE SODIUM PHOSPHATE 10 MG/ML IJ SOLN
INTRAMUSCULAR | Status: DC | PRN
  Administered 2023-11-28: 5 mg via INTRAVENOUS

## 2023-11-28 MED ORDER — FENTANYL CITRATE (PF) 100 MCG/2ML IJ SOLN
INTRAMUSCULAR | Status: AC
  Filled 2023-11-28: qty 2

## 2023-11-28 MED ORDER — LACTATED RINGERS IV SOLN
INTRAVENOUS | Status: DC
Start: 2023-11-28 — End: 2023-11-28

## 2023-11-28 MED ORDER — CHLORHEXIDINE GLUCONATE 0.12 % MT SOLN
OROMUCOSAL | Status: AC
Start: 2023-11-28 — End: 2023-11-28
  Filled 2023-11-28: qty 15

## 2023-11-28 MED ORDER — ONDANSETRON HCL 4 MG/2ML IJ SOLN
INTRAMUSCULAR | Status: AC
  Filled 2023-11-28: qty 2

## 2023-11-28 MED ORDER — PROPOFOL 10 MG/ML IV BOLUS
INTRAVENOUS | Status: DC | PRN
  Administered 2023-11-28: 100 mg via INTRAVENOUS

## 2023-11-28 MED ORDER — PHENYLEPHRINE HCL-NACL 20-0.9 MG/250ML-% IV SOLN
INTRAVENOUS | Status: DC | PRN
  Administered 2023-11-28: 20 ug/min via INTRAVENOUS

## 2023-11-28 MED ORDER — CHLORHEXIDINE GLUCONATE 0.12 % MT SOLN
15.0000 mL | Freq: Once | OROMUCOSAL | Status: AC
  Administered 2023-11-28: 15 mL via OROMUCOSAL

## 2023-11-28 MED ORDER — ONDANSETRON HCL 4 MG/2ML IJ SOLN
INTRAMUSCULAR | Status: DC | PRN
Start: 2023-11-28 — End: 2023-11-28
  Administered 2023-11-28: 4 mg via INTRAVENOUS

## 2023-11-28 MED ORDER — INSULIN ASPART 100 UNIT/ML IJ SOLN
INTRAMUSCULAR | Status: AC
  Filled 2023-11-28: qty 1

## 2023-11-28 MED ORDER — ROCURONIUM BROMIDE 10 MG/ML (PF) SYRINGE
PREFILLED_SYRINGE | INTRAVENOUS | Status: AC
  Filled 2023-11-28: qty 10

## 2023-11-28 MED ORDER — SODIUM CHLORIDE 0.9 % IV SOLN: INTRAVENOUS | Status: DC | PRN

## 2023-11-28 MED ORDER — ORAL CARE MOUTH RINSE: 15.0000 mL | Freq: Once | OROMUCOSAL | Status: AC

## 2023-11-28 MED ORDER — LIDOCAINE HCL (CARDIAC) PF 100 MG/5ML IV SOSY
PREFILLED_SYRINGE | INTRAVENOUS | Status: DC | PRN
  Administered 2023-11-28: 50 mg via INTRAVENOUS

## 2023-11-28 MED ORDER — LIDOCAINE HCL (PF) 2 % IJ SOLN
INTRAMUSCULAR | Status: AC
  Filled 2023-11-28: qty 5

## 2023-11-28 MED ORDER — FENTANYL CITRATE (PF) 100 MCG/2ML IJ SOLN
INTRAMUSCULAR | Status: DC | PRN
  Administered 2023-11-28: 25 ug via INTRAVENOUS

## 2023-11-28 MED ORDER — DEXAMETHASONE SODIUM PHOSPHATE 10 MG/ML IJ SOLN
INTRAMUSCULAR | Status: AC
  Filled 2023-11-28: qty 1

## 2023-11-28 MED ORDER — SUCCINYLCHOLINE CHLORIDE 200 MG/10ML IV SOSY
PREFILLED_SYRINGE | INTRAVENOUS | Status: DC | PRN
  Administered 2023-11-28: 120 mg via INTRAVENOUS

## 2023-11-28 NOTE — Procedures (Signed)
 FIBEROPTIC BRONCHOSCOPY WITH BRONCHOALVEOLAR LAVAGE PROCEDURE NOTE  THERAPEUTIC ASPIRATION OF TRACHEOBRONCHIAL TREE  ENDOBRONCHIAL ULTRASOUND X >/1  LYMPH NODE PROCEDURE NOTE    Flexible bronchoscopy was performed  by : Parris MD  assistance by : 1)Repiratory therapist  and 2)cytotech staff and 3) Anesthesia team and 4) Flouroscopy team and 5) IT supporting staff for robotic platform   Indication for the procedure was :  Pre-procedural H&P. The following assessment was performed on the day of the procedure prior to initiating sedation History:  Chest pain n Dyspnea y Hemoptysis n Cough y Fever n Other pertinent items n  Examination Vital signs -reviewed as per nursing documentation today Cardiac    Murmurs: n  Rubs : n  Gallop: n Lungs Wheezing: n Rales : n Rhonchi :y  Other pertinent findings: SOB/hypoxemia due to chronic lung disease   Pre-procedural assessment for Procedural Sedation included: Depth of sedation: As per anesthesia team  ASA Classification:  2 Mallampati airway assessment: 3    Medication list reviewed: y  The patient's interval history was taken and revealed: no new complaints The pre- procedure physical examination revealed: No new findings Refer to prior clinic note for details.  Informed Consent: Informed consent was obtained from:  patient after explanation of procedure and risks, benefits, as well as alternative procedures available.  Explanation of level of sedation and possible transfusion was also provided.    Procedural Preparation: Time out was performed and patient was identified by name and birthdate and procedure to be performed and side for sampling, if any, was specified. Pt was intubated by anesthesia.  The patient was appropriately draped.   Fiberoptic bronchoscopy with airway inspection, therapeutic aspiration of tracheobronchial tree and BAL Procedure findings:  Bronchoscope was inserted via ETT  without  difficulty.  Posterior oropharynx, epiglottis, arytenoids, false cords and vocal cords were not visualized as these were bypassed by endotracheal tube. The distal trachea was normal in circumference and appearance without mucosal, cartilaginous or branching abnormalities.  The main carina was mildly splayed . All right and left lobar airways were NOT visualized to the Subsegmental level due to obstructed airways from phlegm and mucus  plugging. Mucus plugging with partial to complete airway obstruction was noted bilaterally and treated with therapeutic aspiration prior to beginning of robotic bronchoscopy to allow adequate visualization and accurate lung biopsy and improve patients respiratory status.   The mucosa was : friable bilaterally  Airways were notable for:        exophytic lesions :n       extrinsic compression in the following distributions: n.       Friable mucosa: y       Teacher, music /pigmentation: n     Post procedure Diagnosis:   Mucus plugging of tracheobronchial tree      Endobronchial ultrasound assisted hilar and mediastinal lymph node biopsies procedure findings: The fiberoptic bronchoscope was removed and the EBUS scope was introduced. Examination began to evaluate for pathologically enlarged lymph nodes starting on the left  side progressing to the right side.  All lymph node biopsies performed with 21g needle. Lymph node biopsies were sent in cytolite for all stations.   Station 10L - 6mm not biopsied Station 7 - 7mm not biopsied Station 10R - 1.4cm biopsied 4 times    Post procedure diagnosis:  Right hilar adenopathy     Immediate sampling complications included:NONE immediate  Epinephrine ZERO ml was used topically  The bronchoscopy was terminated due to completion of the planned  procedure and the bronchoscope was removed.   Total dosage of Lidocaine  was 1 mg  Estimated Blood loss: EXPECTED < 5cc.  Complications included:  NONE    Preliminary CXR findings :  IN PROCESS  Disposition: HOME WITH FAMILY   Follow up with Dr. Delpha Perko in 5 days for result discussion.     Halina Picking MD  [facility] Division of Pulmonary & Critical Care Medicine

## 2023-11-28 NOTE — Anesthesia Procedure Notes (Signed)
 Procedure Name: Intubation Date/Time: 11/28/2023 12:04 PM  Performed by: Lorrene Camelia LABOR, CRNAPre-anesthesia Checklist: Patient identified, Emergency Drugs available, Suction available, Patient being monitored and Timeout performed Patient Re-evaluated:Patient Re-evaluated prior to induction Oxygen Delivery Method: Circle system utilized Preoxygenation: Pre-oxygenation with 100% oxygen Induction Type: IV induction Ventilation: Mask ventilation without difficulty Laryngoscope Size: McGrath and 3 Grade View: Grade I Tube type: Oral Tube size: 8.0 mm Number of attempts: 1 Airway Equipment and Method: Stylet and Video-laryngoscopy Placement Confirmation: ETT inserted through vocal cords under direct vision, positive ETCO2 and breath sounds checked- equal and bilateral Secured at: 21 cm Tube secured with: Tape Dental Injury: Teeth and Oropharynx as per pre-operative assessment

## 2023-11-28 NOTE — Transfer of Care (Signed)
 Immediate Anesthesia Transfer of Care Note  Patient: Katie Cunningham  Procedure(s) Performed: BRONCHOSCOPY, FLEXIBLE BRONCHOSCOPY, WITH EBUS  Patient Location: PACU  Anesthesia Type:General  Level of Consciousness: drowsy and patient cooperative  Airway & Oxygen Therapy: Patient Spontanous Breathing and Patient connected to face mask oxygen  Post-op Assessment: Report given to RN and Post -op Vital signs reviewed and stable  Post vital signs: Reviewed and stable  Last Vitals:  Vitals Value Taken Time  BP 156/93 11/28/23 12:51  Temp    Pulse 77 11/28/23 12:51  Resp 17 11/28/23 12:51  SpO2 99 % 11/28/23 12:51  Vitals shown include unfiled device data.  Last Pain:  Vitals:   11/28/23 1251  TempSrc:   PainSc: 0-No pain         Complications: No notable events documented.

## 2023-11-28 NOTE — H&P (Signed)
 PULMONOLOGY         Date: 11/28/2023,   MRN# [id number] Katie Cunningham 03/29/1931     Admission                  Current   Referring provider: Dr Theotis    CHIEF COMPLAINT:   Mediastinal adenopathy and mucus plugging of tracheobronchial tree   HISTORY OF PRESENT ILLNESS   This is a 89 yo with RA ILD and chronic hypoxemia.  She was seen in clinic on outpatient with complaints of worsening dyspnea.  She has been treated with antibiotics without improvement. She was noted to have chest imaging with enlarged right hilar lymph node and mucus plugging with debri in right upper lobe.  She is here today for bronchoscopy with airway inspection , BAL for microbiology and therapeutic aspiration of tracheobronchial tree.  We will then perform endobronchial ultrasound guided lymph node biopsies.  Reviewed risks/complications and benefits with patient, risks include infection, pneumothorax/pneumomediastinum which may require chest tube placement as well as overnight/prolonged hospitalization and possible mechanical ventilation. Other risks include bleeding and very rarely death.  Patient understands risks and wishes to proceed.  Additional questions were answered, and patient is aware that post procedure patient will be going home with family and may experience cough with possible clots on expectoration as well as phlegm which may last few days as well as hoarseness of voice post intubation and mechanical ventilation.    PAST MEDICAL HISTORY   Past Medical History:  Diagnosis Date   Anemia    Aortic atherosclerosis (HCC)    Arthritis    Atypical migraine    Barrett esophagus    Bilateral carotid artery disease (HCC)    CAD (coronary artery disease)    Cardiomegaly    Centrilobular emphysema (HCC)    Cholelithiasis    Chronic diarrhea    CKD (chronic kidney disease), stage III (HCC)    Colon polyps    DDD (degenerative disc disease), lumbar    Diverticulitis    Dyspnea     GERD (gastroesophageal reflux disease)    Hyperlipidemia    Hypertension    Lichen planopilaris of vulva    Mitral stenosis    Osteopenia    Peripheral neuropathy    Peripheral vascular disease (HCC)    a.) s/p BILATERAL iliac stenting   Polyneuropathy    Pulmonary embolism (HCC)    Pulmonary hypertension (HCC)    Rheumatoid lung disease (HCC)    Right upper lobe pulmonary nodule    Scoliosis    Squamous cell skin cancer (nose, neck, leg)    T2DM (type 2 diabetes mellitus) (HCC)    Vasomotor rhinitis      SURGICAL HISTORY   Past Surgical History:  Procedure Laterality Date   APPENDECTOMY     BREAST BIOPSY Left    neg   PERIPHERAL VASCULAR CATHETERIZATION Right 11/07/2015   Procedure: Lower Extremity Angiography;  Surgeon: Cordella KANDICE Shawl, MD;  Location: [facility] INVASIVE CV LAB;  Service: Cardiovascular;  Laterality: Right;   TONSILLECTOMY       FAMILY HISTORY   Family History  Problem Relation Age of Onset   Breast cancer Sister 31     SOCIAL HISTORY   Social History   Tobacco Use   Smoking status: Former    Current packs/day: 1.00    Average packs/day: 1 pack/day for 39.0 years (39.0 ttl pk-yrs)    Types: Cigarettes    Passive exposure: Never  Smokeless tobacco: Never  Substance Use Topics   Alcohol  use: Yes    Alcohol /week: 2.0 standard drinks of alcohol     Types: 2 Glasses of wine per week   Drug use: No     MEDICATIONS    Home Medication:    Current Medication:  Current Facility-Administered Medications:    lactated ringers  infusion, , Intravenous, Continuous, Dario Barter, MD    ALLERGIES   Atorvastatin and Colestipol     REVIEW OF SYSTEMS    Review of Systems:  Gen:  Denies  fever, sweats, chills weigh loss  HEENT: Denies blurred vision, double vision, ear pain, eye pain, hearing loss, nose bleeds, sore throat Cardiac:  No dizziness, chest pain or heaviness, chest tightness,edema Resp:   reports dyspnea chronically   Gi: Denies swallowing difficulty, stomach pain, nausea or vomiting, diarrhea, constipation, bowel incontinence Gu:  Denies bladder incontinence, burning urine Ext:   Denies Joint pain, stiffness or swelling Skin: Denies  skin rash, easy bruising or bleeding or hives Endoc:  Denies polyuria, polydipsia , polyphagia or weight change Psych:   Denies depression, insomnia or hallucinations   Other:  All other systems negative   VS: BP (!) 192/86   Pulse 83   Temp (!) 97 F (36.1 C) (Temporal)   Resp 17   SpO2 96%      PHYSICAL EXAM    GENERAL:NAD, no fevers, chills, no weakness no fatigue HEAD: Normocephalic, atraumatic.  EYES: Pupils equal, round, reactive to light. Extraocular muscles intact. No scleral icterus.  MOUTH: Moist mucosal membrane. Dentition intact. No abscess noted.  EAR, NOSE, THROAT: Clear without exudates. No external lesions.  NECK: Supple. No thyromegaly. No nodules. No JVD.  PULMONARY: decreased breath sounds with mild rhonchi worse at bases bilaterally.  CARDIOVASCULAR: S1 and S2. Regular rate and rhythm. No murmurs, rubs, or gallops. No edema. Pedal pulses 2+ bilaterally.  GASTROINTESTINAL: Soft, nontender, nondistended. No masses. Positive bowel sounds. No hepatosplenomegaly.  MUSCULOSKELETAL: No swelling, clubbing, or edema. Range of motion full in all extremities.  NEUROLOGIC: Cranial nerves II through XII are intact. No gross focal neurological deficits. Sensation intact. Reflexes intact.  SKIN: No ulceration, lesions, rashes, or cyanosis. Skin warm and dry. Turgor intact.  PSYCHIATRIC: Mood, affect within normal limits. The patient is awake, alert and oriented x 3. Insight, judgment intact.       IMAGING   @IMAGES @  Study Result Narrative & Impression CLINICAL DATA:  Positive D dimer, Elevated d-dimer, Shortness of breath, Other acute pulmonary embolism   EXAM: CT ANGIOGRAPHY CHEST WITH CONTRAST   TECHNIQUE: Multidetector CT imaging of the  chest was performed using the standard protocol during bolus administration of intravenous contrast. Multiplanar CT image reconstructions and MIPs were obtained to evaluate the vascular anatomy.   RADIATION DOSE REDUCTION: This exam was performed according to the departmental dose-optimization program which includes automated exposure control, adjustment of the mA and/or kV according to patient size and/or use of iterative reconstruction technique.   CONTRAST:  60mL OMNIPAQUE  IOHEXOL  350 MG/ML SOLN   COMPARISON:  CT angiography chest 07/31/2022   FINDINGS: Cardiovascular: Satisfactory opacification of the pulmonary arteries to the segmental level. No evidence of pulmonary embolism. The main pulmonary artery is normal in caliber. Enlarged heart size. No significant pericardial effusion. The thoracic aorta is normal in caliber. Severe atherosclerotic plaque of the thoracic aorta. Four-vessel coronary artery calcifications. Aortic valve leaflet calcification.   Mediastinum/Nodes: Stable enlarged right hilar lymph node measuring 1.7 cm. No enlarged mediastinal, left  hilar, or axillary lymph nodes. Thyroid gland, trachea, and esophagus demonstrate no significant findings.   Lungs/Pleura: Centrilobular emphysematous changes. Peripheral reticulations. Debris within a right upper lobe bronchial (7:32, 6:67). Associated peribronchovascular ground-glass nodule like airspace opacities in this region measuring up to 7 mm. No pulmonary mass. No pleural effusion. No pneumothorax.   Upper Abdomen: Cholelithiasis.   Musculoskeletal:   No chest wall abnormality.   No suspicious lytic or blastic osseous lesions. No acute displaced fracture. Severe right shoulder degenerative changes.   Review of the MIP images confirms the above findings.   IMPRESSION: 1. No pulmonary embolus. 2. Debris within a right upper lobe bronchial with associated peribronchovascular ground-glass nodule like  airspace opacities in this region measuring up to 7 mm. Finding may represent mucous plugging with bronchiolitis. 3. Indeterminate stable right lymphadenopathy. Finding may be reactive in etiology. Recommend attention on follow-up. 4. Cardiomegaly. 5.  Emphysema (ICD10-J43.9). 6. Aortic Atherosclerosis (ICD10-I70.0) including four-vessel and aortic valve leaflet calcifications-correlate with aortic stenosis. 7. Cholelithiasis with no acute cholecystitis.   ASSESSMENT/PLAN   Right hilar adenopathy and mucus plugging of tracheobronchial tree   She is here today for bronchoscopy with airway inspection , BAL for microbiology and therapeutic aspiration of tracheobronchial tree.  We will then perform endobronchial ultrasound guided lymph node biopsies.  Reviewed risks/complications and benefits with patient, risks include infection, pneumothorax/pneumomediastinum which may require chest tube placement as well as overnight/prolonged hospitalization and possible mechanical ventilation. Other risks include bleeding and very rarely death.  Patient understands risks and wishes to proceed.  Additional questions were answered, and patient is aware that post procedure patient will be going home with family and may experience cough with possible clots on expectoration as well as phlegm which may last few days as well as hoarseness of voice post intubation and mechanical ventilation.       Thank you for allowing me to participate in the care of this patient.   Patient/Family are satisfied with care plan and all questions have been answered.    Provider disclosure: Patient with at least one acute or chronic illness or injury that poses a threat to life or bodily function and is being managed actively during this encounter.  All of the below services have been performed independently by signing provider:  review of prior documentation from internal and or external health records.  Review of previous and  current lab results.  Interview and comprehensive assessment during patient visit today. Review of current and previous chest radiographs/CT scans. Discussion of management and test interpretation with health care team and patient/family.   This document was prepared using Dragon voice recognition software and may include unintentional dictation errors.     Shermika Balthaser, M.D.  Division of Pulmonary & Critical Care Medicine

## 2023-11-28 NOTE — Anesthesia Preprocedure Evaluation (Addendum)
 Anesthesia Evaluation  Patient identified by MRN, date of birth, ID band Patient awake    Reviewed: Allergy & Precautions, NPO status , Patient's Chart, lab work & pertinent test results  History of Anesthesia Complications Negative for: history of anesthetic complications  Airway Mallampati: III  TM Distance: <3 FB Neck ROM: full    Dental  (+) Chipped, Poor Dentition, Missing, Loose   Pulmonary shortness of breath, COPD,  COPD inhaler and oxygen dependent, former smoker    + decreased breath sounds      Cardiovascular hypertension, pulmonary hypertension+ Peripheral Vascular Disease  Normal cardiovascular exam     Neuro/Psych  Headaches  Neuromuscular disease  negative psych ROS   GI/Hepatic Neg liver ROS,GERD  ,,  Endo/Other  diabetes, Type 2    Renal/GU Renal disease     Musculoskeletal   Abdominal   Peds  Hematology negative hematology ROS (+)   Anesthesia Other Findings Patient has cardiac clearance for this procedure.   Past Medical History: No date: Anemia No date: Aortic atherosclerosis (HCC) No date: Arthritis No date: Atypical migraine No date: Barrett esophagus No date: Bilateral carotid artery disease (HCC) No date: CAD (coronary artery disease) No date: Cardiomegaly No date: Centrilobular emphysema (HCC) No date: Cholelithiasis No date: Chronic diarrhea No date: CKD (chronic kidney disease), stage III (HCC) No date: Colon polyps No date: DDD (degenerative disc disease), lumbar No date: Diverticulitis No date: Dyspnea No date: GERD (gastroesophageal reflux disease) No date: Hyperlipidemia No date: Hypertension No date: Lichen planopilaris of vulva No date: Mitral stenosis No date: Osteopenia No date: Peripheral neuropathy No date: Peripheral vascular disease (HCC)     Comment:  a.) s/p BILATERAL iliac stenting No date: Polyneuropathy No date: Pulmonary embolism (HCC) No date:  Pulmonary hypertension (HCC) No date: Rheumatoid lung disease (HCC) No date: Right upper lobe pulmonary nodule No date: Scoliosis No date: Squamous cell skin cancer (nose, neck, leg) No date: T2DM (type 2 diabetes mellitus) (HCC) No date: Vasomotor rhinitis  Past Surgical History: No date: APPENDECTOMY No date: BREAST BIOPSY; Left     Comment:  neg 11/07/2015: PERIPHERAL VASCULAR CATHETERIZATION; Right     Comment:  Procedure: Lower Extremity Angiography;  Surgeon:               Cordella KANDICE Shawl, MD;  Location: [facility] INVASIVE CV LAB;                Service: Cardiovascular;  Laterality: Right; No date: TONSILLECTOMY     Reproductive/Obstetrics negative OB ROS                              Anesthesia Physical Anesthesia Plan  ASA: 4  Anesthesia Plan: General ETT   Post-op Pain Management:    Induction: Intravenous  PONV Risk Score and Plan: Ondansetron , Dexamethasone , Midazolam  and Treatment may vary due to age or medical condition  Airway Management Planned: Oral ETT  Additional Equipment:   Intra-op Plan:   Post-operative Plan: Extubation in OR and Possible Post-op intubation/ventilation  Informed Consent: I have reviewed the patients History and Physical, chart, labs and discussed the procedure including the risks, benefits and alternatives for the proposed anesthesia with the patient or authorized representative who has indicated his/her understanding and acceptance.     Dental Advisory Given  Plan Discussed with: Anesthesiologist, CRNA and Surgeon  Anesthesia Plan Comments: (Patient consented for risks of anesthesia including but not limited to:  - adverse  reactions to medications - damage to eyes, teeth, lips or other oral mucosa - nerve damage due to positioning  - sore throat or hoarseness - Damage to heart, brain, nerves, lungs, other parts of body or loss of life  Patient voiced understanding and assent.)          Anesthesia Quick Evaluation

## 2023-11-28 NOTE — Anesthesia Postprocedure Evaluation (Signed)
 Anesthesia Post Note  Patient: Ronal Verneita Cook  Procedure(s) Performed: BRONCHOSCOPY, FLEXIBLE BRONCHOSCOPY, WITH EBUS  Patient location during evaluation: PACU Anesthesia Type: General Level of consciousness: awake and alert Pain management: pain level controlled Vital Signs Assessment: post-procedure vital signs reviewed and stable Respiratory status: spontaneous breathing, nonlabored ventilation, respiratory function stable and patient connected to nasal cannula oxygen Cardiovascular status: blood pressure returned to baseline and stable Postop Assessment: no apparent nausea or vomiting Anesthetic complications: no   No notable events documented.   Last Vitals:  Vitals:   11/28/23 1345 11/28/23 1419  BP: (!) 157/85 (!) 146/82  Pulse: 65 69  Resp: (!) 23 18  Temp: (!) 36.1 C (!) 36.1 C  SpO2: 100% 99%    Last Pain:  Vitals:   11/28/23 1419  TempSrc: Temporal  PainSc: 0-No pain                 Fairy POUR Oluwademilade Kellett

## 2023-11-29 ENCOUNTER — Encounter: Payer: Self-pay | Admitting: Pulmonary Disease

## 2023-11-30 LAB — ACID FAST SMEAR (AFB, MYCOBACTERIA): Acid Fast Smear: NEGATIVE

## 2023-12-01 ENCOUNTER — Encounter: Payer: Self-pay | Admitting: Pulmonary Disease

## 2023-12-01 LAB — CULTURE, BAL-QUANTITATIVE W GRAM STAIN
Culture: NO GROWTH
Gram Stain: NONE SEEN

## 2023-12-02 LAB — CYTOLOGY - NON PAP

## 2023-12-02 LAB — SURGICAL PATHOLOGY

## 2023-12-03 ENCOUNTER — Encounter: Payer: Self-pay | Admitting: Pulmonary Disease

## 2023-12-11 DIAGNOSIS — Z23 Encounter for immunization: Secondary | ICD-10-CM | POA: Diagnosis not present

## 2023-12-11 DIAGNOSIS — J449 Chronic obstructive pulmonary disease, unspecified: Secondary | ICD-10-CM | POA: Diagnosis not present

## 2023-12-12 DIAGNOSIS — J432 Centrilobular emphysema: Secondary | ICD-10-CM | POA: Diagnosis not present

## 2023-12-12 DIAGNOSIS — M19031 Primary osteoarthritis, right wrist: Secondary | ICD-10-CM | POA: Diagnosis not present

## 2023-12-12 DIAGNOSIS — S52514D Nondisplaced fracture of right radial styloid process, subsequent encounter for closed fracture with routine healing: Secondary | ICD-10-CM | POA: Diagnosis not present

## 2023-12-12 DIAGNOSIS — M1711 Unilateral primary osteoarthritis, right knee: Secondary | ICD-10-CM | POA: Diagnosis not present

## 2023-12-12 DIAGNOSIS — M47812 Spondylosis without myelopathy or radiculopathy, cervical region: Secondary | ICD-10-CM | POA: Diagnosis not present

## 2023-12-12 DIAGNOSIS — M160 Bilateral primary osteoarthritis of hip: Secondary | ICD-10-CM | POA: Diagnosis not present

## 2023-12-12 DIAGNOSIS — E1142 Type 2 diabetes mellitus with diabetic polyneuropathy: Secondary | ICD-10-CM | POA: Diagnosis not present

## 2023-12-12 DIAGNOSIS — M1811 Unilateral primary osteoarthritis of first carpometacarpal joint, right hand: Secondary | ICD-10-CM | POA: Diagnosis not present

## 2023-12-12 DIAGNOSIS — M4312 Spondylolisthesis, cervical region: Secondary | ICD-10-CM | POA: Diagnosis not present

## 2023-12-16 DIAGNOSIS — M1711 Unilateral primary osteoarthritis, right knee: Secondary | ICD-10-CM | POA: Diagnosis not present

## 2023-12-16 DIAGNOSIS — M4312 Spondylolisthesis, cervical region: Secondary | ICD-10-CM | POA: Diagnosis not present

## 2023-12-16 DIAGNOSIS — M19031 Primary osteoarthritis, right wrist: Secondary | ICD-10-CM | POA: Diagnosis not present

## 2023-12-16 DIAGNOSIS — M47812 Spondylosis without myelopathy or radiculopathy, cervical region: Secondary | ICD-10-CM | POA: Diagnosis not present

## 2023-12-16 DIAGNOSIS — J432 Centrilobular emphysema: Secondary | ICD-10-CM | POA: Diagnosis not present

## 2023-12-16 DIAGNOSIS — M1811 Unilateral primary osteoarthritis of first carpometacarpal joint, right hand: Secondary | ICD-10-CM | POA: Diagnosis not present

## 2023-12-16 DIAGNOSIS — S52514D Nondisplaced fracture of right radial styloid process, subsequent encounter for closed fracture with routine healing: Secondary | ICD-10-CM | POA: Diagnosis not present

## 2023-12-16 DIAGNOSIS — E1142 Type 2 diabetes mellitus with diabetic polyneuropathy: Secondary | ICD-10-CM | POA: Diagnosis not present

## 2023-12-19 LAB — CULTURE, FUNGUS WITHOUT SMEAR

## 2023-12-24 DIAGNOSIS — E1142 Type 2 diabetes mellitus with diabetic polyneuropathy: Secondary | ICD-10-CM | POA: Diagnosis not present

## 2023-12-24 DIAGNOSIS — M47812 Spondylosis without myelopathy or radiculopathy, cervical region: Secondary | ICD-10-CM | POA: Diagnosis not present

## 2023-12-24 DIAGNOSIS — M19031 Primary osteoarthritis, right wrist: Secondary | ICD-10-CM | POA: Diagnosis not present

## 2023-12-24 DIAGNOSIS — M1811 Unilateral primary osteoarthritis of first carpometacarpal joint, right hand: Secondary | ICD-10-CM | POA: Diagnosis not present

## 2023-12-24 DIAGNOSIS — J432 Centrilobular emphysema: Secondary | ICD-10-CM | POA: Diagnosis not present

## 2023-12-24 DIAGNOSIS — M160 Bilateral primary osteoarthritis of hip: Secondary | ICD-10-CM | POA: Diagnosis not present

## 2023-12-24 DIAGNOSIS — S52514D Nondisplaced fracture of right radial styloid process, subsequent encounter for closed fracture with routine healing: Secondary | ICD-10-CM | POA: Diagnosis not present

## 2023-12-24 DIAGNOSIS — M1711 Unilateral primary osteoarthritis, right knee: Secondary | ICD-10-CM | POA: Diagnosis not present

## 2024-01-12 LAB — ACID FAST CULTURE WITH REFLEXED SENSITIVITIES (MYCOBACTERIA): Acid Fast Culture: NEGATIVE

## 2024-01-15 DIAGNOSIS — M150 Primary generalized (osteo)arthritis: Secondary | ICD-10-CM | POA: Diagnosis not present

## 2024-01-24 DIAGNOSIS — E1142 Type 2 diabetes mellitus with diabetic polyneuropathy: Secondary | ICD-10-CM | POA: Diagnosis not present

## 2024-01-27 DIAGNOSIS — M8589 Other specified disorders of bone density and structure, multiple sites: Secondary | ICD-10-CM | POA: Diagnosis not present

## 2024-01-27 DIAGNOSIS — E119 Type 2 diabetes mellitus without complications: Secondary | ICD-10-CM | POA: Diagnosis not present

## 2024-01-27 DIAGNOSIS — G4700 Insomnia, unspecified: Secondary | ICD-10-CM | POA: Diagnosis not present

## 2024-01-27 DIAGNOSIS — R630 Anorexia: Secondary | ICD-10-CM | POA: Diagnosis not present

## 2024-01-27 DIAGNOSIS — R519 Headache, unspecified: Secondary | ICD-10-CM | POA: Diagnosis not present

## 2024-01-27 DIAGNOSIS — I499 Cardiac arrhythmia, unspecified: Secondary | ICD-10-CM | POA: Diagnosis not present

## 2024-01-27 DIAGNOSIS — E1169 Type 2 diabetes mellitus with other specified complication: Secondary | ICD-10-CM | POA: Diagnosis not present

## 2024-01-27 DIAGNOSIS — Z79899 Other long term (current) drug therapy: Secondary | ICD-10-CM | POA: Diagnosis not present

## 2024-01-27 DIAGNOSIS — Z794 Long term (current) use of insulin: Secondary | ICD-10-CM | POA: Diagnosis not present

## 2024-01-27 DIAGNOSIS — E559 Vitamin D deficiency, unspecified: Secondary | ICD-10-CM | POA: Diagnosis not present

## 2024-01-27 DIAGNOSIS — E1142 Type 2 diabetes mellitus with diabetic polyneuropathy: Secondary | ICD-10-CM | POA: Diagnosis not present

## 2024-01-27 DIAGNOSIS — N1832 Chronic kidney disease, stage 3b: Secondary | ICD-10-CM | POA: Diagnosis not present

## 2024-01-27 DIAGNOSIS — J439 Emphysema, unspecified: Secondary | ICD-10-CM | POA: Diagnosis not present

## 2024-01-27 DIAGNOSIS — R634 Abnormal weight loss: Secondary | ICD-10-CM | POA: Diagnosis not present

## 2024-02-13 DIAGNOSIS — R918 Other nonspecific abnormal finding of lung field: Secondary | ICD-10-CM | POA: Diagnosis not present

## 2024-02-13 DIAGNOSIS — J432 Centrilobular emphysema: Secondary | ICD-10-CM | POA: Diagnosis not present

## 2024-02-14 DIAGNOSIS — I272 Pulmonary hypertension, unspecified: Secondary | ICD-10-CM | POA: Diagnosis not present
